# Patient Record
Sex: Female | Born: 1960 | Race: Black or African American | Hispanic: No | Marital: Married | Smoking: Never smoker
Health system: Southern US, Community
[De-identification: ages and names within clinical notes are randomized; demographics above are authoritative.]

## PROBLEM LIST (undated history)

## (undated) DIAGNOSIS — E785 Hyperlipidemia, unspecified: Secondary | ICD-10-CM

## (undated) DIAGNOSIS — M8580 Other specified disorders of bone density and structure, unspecified site: Secondary | ICD-10-CM

## (undated) DIAGNOSIS — IMO0002 Reserved for concepts with insufficient information to code with codable children: Secondary | ICD-10-CM

## (undated) DIAGNOSIS — Z8249 Family history of ischemic heart disease and other diseases of the circulatory system: Secondary | ICD-10-CM

## (undated) DIAGNOSIS — R011 Cardiac murmur, unspecified: Secondary | ICD-10-CM

## (undated) DIAGNOSIS — D649 Anemia, unspecified: Secondary | ICD-10-CM

## (undated) DIAGNOSIS — D563 Thalassemia minor: Secondary | ICD-10-CM

## (undated) DIAGNOSIS — M329 Systemic lupus erythematosus, unspecified: Secondary | ICD-10-CM

## (undated) DIAGNOSIS — M179 Osteoarthritis of knee, unspecified: Secondary | ICD-10-CM

## (undated) DIAGNOSIS — D569 Thalassemia, unspecified: Secondary | ICD-10-CM

## (undated) HISTORY — PX: COLONOSCOPY: SHX174

## (undated) HISTORY — DX: Anemia, unspecified: D64.9

## (undated) HISTORY — DX: Thalassemia minor: D56.3

## (undated) HISTORY — PX: TUBAL LIGATION: SHX77

## (undated) HISTORY — DX: Cardiac murmur, unspecified: R01.1

## (undated) HISTORY — DX: Systemic lupus erythematosus, unspecified: M32.9

## (undated) HISTORY — DX: Osteoarthritis of knee, unspecified: M17.9

## (undated) HISTORY — DX: Other specified disorders of bone density and structure, unspecified site: M85.80

## (undated) HISTORY — DX: Family history of ischemic heart disease and other diseases of the circulatory system: Z82.49

## (undated) HISTORY — DX: Reserved for concepts with insufficient information to code with codable children: IMO0002

## (undated) HISTORY — PX: TONSILLECTOMY: SHX5217

## (undated) HISTORY — DX: Hyperlipidemia, unspecified: E78.5

## (undated) HISTORY — DX: Thalassemia, unspecified: D56.9

---

## 2015-01-11 ENCOUNTER — Ambulatory Visit (INDEPENDENT_AMBULATORY_CARE_PROVIDER_SITE_OTHER): Payer: 59 | Admitting: Family Medicine

## 2015-01-11 VITALS — BP 102/64 | HR 71 | Temp 98.0°F | Resp 18 | Ht 66.0 in | Wt 161.0 lb

## 2015-01-11 DIAGNOSIS — Z130 Encounter for screening for diseases of the blood and blood-forming organs and certain disorders involving the immune mechanism: Secondary | ICD-10-CM | POA: Diagnosis not present

## 2015-01-11 DIAGNOSIS — Z13220 Encounter for screening for lipoid disorders: Secondary | ICD-10-CM | POA: Diagnosis not present

## 2015-01-11 DIAGNOSIS — L723 Sebaceous cyst: Secondary | ICD-10-CM

## 2015-01-11 DIAGNOSIS — Z1329 Encounter for screening for other suspected endocrine disorder: Secondary | ICD-10-CM

## 2015-01-11 DIAGNOSIS — N9089 Other specified noninflammatory disorders of vulva and perineum: Secondary | ICD-10-CM

## 2015-01-11 DIAGNOSIS — Z1239 Encounter for other screening for malignant neoplasm of breast: Secondary | ICD-10-CM

## 2015-01-11 DIAGNOSIS — Z1211 Encounter for screening for malignant neoplasm of colon: Secondary | ICD-10-CM

## 2015-01-11 DIAGNOSIS — Z0000 Encounter for general adult medical examination without abnormal findings: Secondary | ICD-10-CM

## 2015-01-11 DIAGNOSIS — N907 Vulvar cyst: Secondary | ICD-10-CM | POA: Diagnosis not present

## 2015-01-11 LAB — COMPLETE METABOLIC PANEL WITH GFR
ALT: <8 U/L (ref 0–35)
Albumin: 4 g/dL (ref 3.5–5.2)
Alkaline Phosphatase: 55 U/L (ref 39–117)
BUN: 8 mg/dL (ref 6–23)
CO2: 26 mEq/L (ref 19–32)
Calcium: 9 mg/dL (ref 8.4–10.5)
Creat: 0.61 mg/dL (ref 0.50–1.10)
Glucose, Bld: 63 mg/dL — ABNORMAL LOW (ref 70–99)
Potassium: 4.4 mEq/L (ref 3.5–5.3)
Sodium: 137 mEq/L (ref 135–145)
Total Bilirubin: 0.8 mg/dL (ref 0.2–1.2)

## 2015-01-11 LAB — CBC
HCT: 35 % — ABNORMAL LOW (ref 36.0–46.0)
Hemoglobin: 10.5 g/dL — ABNORMAL LOW (ref 12.0–15.0)
MCH: 23 pg — ABNORMAL LOW (ref 26.0–34.0)
MCHC: 30 g/dL (ref 30.0–36.0)
MCV: 76.8 fL — ABNORMAL LOW (ref 78.0–100.0)
MPV: 10.4 fL (ref 8.6–12.4)
Platelets: 299 10*3/uL (ref 150–400)
RBC: 4.56 MIL/uL (ref 3.87–5.11)
RDW: 13.9 % (ref 11.5–15.5)
WBC: 5.8 10*3/uL (ref 4.0–10.5)

## 2015-01-11 LAB — TSH: TSH: 0.889 u[IU]/mL (ref 0.350–4.500)

## 2015-01-11 LAB — COMPLETE METABOLIC PANEL WITHOUT GFR
AST: 15 U/L (ref 0–37)
Chloride: 105 meq/L (ref 96–112)
GFR, Est African American: >89 mL/min
GFR, Est Non African American: >89 mL/min
Total Protein: 7.4 g/dL (ref 6.0–8.3)

## 2015-01-11 LAB — LDL CHOLESTEROL, DIRECT: Direct LDL: 89 mg/dL

## 2015-01-11 NOTE — Patient Instructions (Signed)
Mammography Mammography is an X-ray of the breasts to look for changes that are not normal. The X-ray image is called a mammogram. This procedure can screen for breast cancer, can detect cancer early, and can diagnose cancer.  LET YOUR CAREGIVER KNOW ABOUT:  Breast implants.  Previous breast disease, biopsy, or surgery.  If you are breastfeeding.  Medicines taken, including vitamins, herbs, eyedrops, over-the-counter medicines, and creams.  Use of steroids (by mouth or creams).  Possibility of pregnancy, if this applies. RISKS AND COMPLICATIONS  Exposure to radiation, but at very low levels.  The results may be misinterpreted.  The results may not be accurate.  Mammography may lead to further tests.  Mammography may not catch certain cancers. BEFORE THE PROCEDURE  Schedule your test about 7 days after your menstrual period. This is when your breasts are the least tender and have signs of hormone changes.  If you have had a mammography done at a different facility in the past, get the mammogram X-rays or have them sent to your current exam facility in order to compare them.  Wash your breasts and under your arms the day of the test.  Do not wear deodorants, perfumes, or powders anywhere on your body.  Wear clothes that you can change in and out of easily. PROCEDURE Relax as much as possible during the test. Any discomfort during the test will be very brief. The test should take less than 30 minutes. The following will happen:  You will undress from the waist up and put on a gown.  You will stand in front of the X-ray machine.  Each breast will be placed between 2 plastic or glass plates. The plates will compress your breast for a few seconds.  X-rays will be taken from different angles of the breast. AFTER THE PROCEDURE  The mammogram will be examined.  Depending on the quality of the images, you may need to repeat certain parts of the test.  Ask when your test  results will be ready. Make sure you get your test results.  You may resume normal activities. Document Released: 06/27/2000 Document Revised: 09/22/2011 Document Reviewed: 04/20/2011 ExitCare Patient Information 2015 [employer]. This information is not intended to replace advice given to you by your health care provider. Make sure you discuss any questions you have with your health care provider.  

## 2015-01-11 NOTE — Progress Notes (Signed)
Chief Complaint:  Chief Complaint  Patient presents with  . Annual Exam    last pap 2015    HPI: Alicia Garcia is a 54 y.o. female who is here for   Last annual was Dec 06, 2013--Dr. Heath Gold Last pap was 2015  was normal Abnormal pap in 30s, no abnormal pap since.  She does occ self breast exam, last mammogram was five years, normal.  No hx of cancer, variant, breast, uterine, colon Has not had vision check.  Dx lupus 2 years ago, April 2014 ( not sure but maybe on the fence---Rheumatology Dr Geraldo Docker) No prior hx of steroids.  No sxs and no arm pain.  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  She has a vaginal skin tag or something that bothers her when she wears certain clothing such as underwear. It rubs up against her close it makes it hurt. She denies any bleeding or hematuria or dysuria or vaginal discharge    Past Medical History  Diagnosis Date  . Anemia   . Lupus   . Heart murmur     as a child   Past Surgical History  Procedure Laterality Date  . Tubal ligation    . Tonsillectomy    . Colonoscopy      2012, normal    History   Social History  . Marital Status: Married    Spouse Name: N/A  . Number of Children: N/A  . Years of Education: N/A   Social History Main Topics  . Smoking status: Never Smoker   . Smokeless tobacco: Not on file  . Alcohol Use: No  . Drug Use: No  . Sexual Activity: Not on file   Other Topics Concern  . None   Social History Narrative  . None   Family History  Problem Relation Age of Onset  . Heart disease Brother    No Known Allergies Prior to Admission medications   Medication Sig Start Date End Date Taking? Authorizing Provider  acetaminophen (TYLENOL) 500 MG tablet Take 500 mg by mouth every 6 (six) hours as needed.   Yes Historical Provider, MD  aspirin EC 81 MG tablet Take 81 mg by mouth daily.   Yes Historical  Provider, MD  Ibuprofen (IBU-200 PO) Take by mouth as needed.   Yes Historical Provider, MD     ROS: The patient denies fevers, chills, night sweats, unintentional weight loss, chest pain, palpitations, wheezing, dyspnea on exertion, nausea, vomiting, abdominal pain, dysuria, hematuria, melena, numbness, weakness, or tingling.   All other systems have been reviewed and were otherwise negative with the exception of those mentioned in the HPI and as above.    PHYSICAL EXAM: Filed Vitals:   01/11/15 1210  BP: 102/64  Pulse: 71  Temp: 98 F (36.7 C)  Resp: 18   Filed Vitals:   01/11/15 1210  Height: _0  (1.676 m)  Weight: 161 lb (73.029 kg)   Body mass index is 26 kg/(m^2).   General: Alert, no acute distress HEENT:  Normocephalic, atraumatic, oropharynx patent. EOMI, PERRLA Cardiovascular:  Regular rate and rhythm, no rubs murmurs or gallops.  No Carotid bruits, radial pulse intact. No pedal edema.  Respiratory: Clear to auscultation bilaterally.  No wheezes, rales, or rhonchi.  No cyanosis, no use of accessory musculature GI: No organomegaly, abdomen is soft and non-tender, positive bowel sounds.  No masses. Skin:  No rashes. Neurologic: Facial musculature symmetric. Psychiatric: Patient is appropriate throughout our interaction. Lymphatic: No cervical lymphadenopathy Musculoskeletal: Gait intact. Cervical exam normal . No rashes, no dc, no CMT Breast exam normal She does have a small  nonerthematous skin tag perilabial  area    LABS: Results for orders placed or performed in visit on 01/11/15  CBC  Result Value Ref Range   WBC 5.8 4.0 - 10.5 K/uL   RBC 4.56 3.87 - 5.11 MIL/uL   Hemoglobin 10.5 (L) 12.0 - 15.0 g/dL   HCT 35.0 (L) 36.0 - 46.0 %   MCV 76.8 (L) 78.0 - 100.0 fL   MCH 23.0 (L) 26.0 - 34.0 pg   MCHC 30.0 30.0 - 36.0 g/dL   RDW 13.9 11.5 - 15.5 %   Platelets 299 150 - 400 K/uL   MPV 10.4 8.6 - 12.4 fL  COMPLETE METABOLIC PANEL WITH GFR  Result Value  Ref Range   Sodium 137 135 - 145 mEq/L   Potassium 4.4 3.5 - 5.3 mEq/L   Chloride 105 96 - 112 mEq/L   CO2 26 19 - 32 mEq/L   Glucose, Bld 63 (L) 70 - 99 mg/dL   BUN 8 6 - 23 mg/dL   Creat 0.61 0.50 - 1.10 mg/dL   Total Bilirubin 0.8 0.2 - 1.2 mg/dL   Alkaline Phosphatase 55 39 - 117 U/L   AST 15 0 - 37 U/L   ALT <8 0 - 35 U/L   Total Protein 7.4 6.0 - 8.3 g/dL   Albumin 4.0 3.5 - 5.2 g/dL   Calcium 9.0 8.4 - 10.5 mg/dL   GFR, Est African American >89 mL/min   GFR, Est Non African American >89 mL/min  TSH  Result Value Ref Range   TSH 0.889 0.350 - 4.500 uIU/mL  LDL Cholesterol, Direct  Result Value Ref Range   Direct LDL 89 mg/dL  IFOBT POC (occult bld, rslt in office)  Result Value Ref Range   IFOBT Negative      EKG/XRAY:   Primary read interpreted by Dr. Marin Comment at [facility].   ASSESSMENT/PLAN: Encounter Diagnoses  Name Primary?  . Annual physical exam Yes  . Screening for deficiency anemia   . Screening for hyperlipidemia   . Screening for thyroid disorder   . Special screening for malignant neoplasms, colon   . Screening for breast cancer   . Labial skin tag   . Sebaceous cyst of labia    Annual labs pending Will refer to ob/gyn or plastics for labial skin tag Refer to get mammogram, she has had a colonoscopy within the last 10 years. She does not remember exactly when. She will look in her records and we will try to get records from Dr. Elliot Cousin office. She'll be sent home with a hemosure kit to bring back  Fu prn , potherwise in 1 year  Gross sideeffects, risk and benefits, and alternatives of medications d/w patient. Patient is aware that all medications have potential sideeffects and we are unable to predict every sideeffect or drug-drug interaction that may occur.  [place] Stringfield, [place], DO 01/22/2015 7:47 AM

## 2015-01-20 LAB — IFOBT (OCCULT BLOOD): IFOBT: NEGATIVE

## 2015-02-22 LAB — HM MAMMOGRAPHY

## 2015-03-01 ENCOUNTER — Encounter: Payer: Self-pay | Admitting: *Deleted

## 2015-03-15 ENCOUNTER — Encounter: Payer: Self-pay | Admitting: Family Medicine

## 2015-06-11 ENCOUNTER — Ambulatory Visit (INDEPENDENT_AMBULATORY_CARE_PROVIDER_SITE_OTHER): Payer: 59 | Admitting: Emergency Medicine

## 2015-06-11 ENCOUNTER — Ambulatory Visit (INDEPENDENT_AMBULATORY_CARE_PROVIDER_SITE_OTHER): Payer: 59

## 2015-06-11 VITALS — BP 118/72 | HR 94 | Temp 97.9°F | Resp 18 | Ht 67.0 in | Wt 160.8 lb

## 2015-06-11 DIAGNOSIS — M25511 Pain in right shoulder: Secondary | ICD-10-CM | POA: Diagnosis not present

## 2015-06-11 MED ORDER — NAPROXEN SODIUM 550 MG PO TABS: 550.0000 mg | ORAL_TABLET | Freq: Two times a day (BID) | ORAL | Status: AC

## 2015-06-11 NOTE — Progress Notes (Signed)
Subjective:  Patient ID: Alicia Garcia, female    DOB: 05/07/1961  Age: 54 y.o. MRN: [id number]  CC: Arm Injury   HPI Alicia Garcia presents  she was injured on Saturday while stepping off a ledge onto a roller rink and fell on her right outstretched arm. She has pain and limitation of motion in her right shoulder since the injury. She has no deformity or ecchymosis or swelling. No radiation of pain she has pain with abduction of her arm.  History Alicia Garcia has a past medical history of Anemia; Lupus (HCC); and Heart murmur.   She has past surgical history that includes Tubal ligation; Tonsillectomy; and Colonoscopy.   Her  family history includes Heart disease in her brother.  She   reports that she has never smoked. She does not have any smokeless tobacco history on file. She reports that she does not drink alcohol or use illicit drugs.  Outpatient Prescriptions Prior to Visit  Medication Sig Dispense Refill  . acetaminophen (TYLENOL) 500 MG tablet Take 500 mg by mouth every 6 (six) hours as needed.    Marland Kitchen aspirin EC 81 MG tablet Take 81 mg by mouth daily.    . Ibuprofen (IBU-200 PO) Take by mouth as needed.     No facility-administered medications prior to visit.    Social History   Social History  . Marital Status: Married    Spouse Name: N/A  . Number of Children: N/A  . Years of Education: N/A   Social History Main Topics  . Smoking status: Never Smoker   . Smokeless tobacco: None  . Alcohol Use: No  . Drug Use: No  . Sexual Activity: Not Asked   Other Topics Concern  . None   Social History Narrative     Review of Systems  Constitutional: Negative for fever, chills and appetite change.  HENT: Negative for congestion, ear pain, postnasal drip, sinus pressure and sore throat.   Eyes: Negative for pain and redness.  Respiratory: Negative for cough, shortness of breath and wheezing.   Cardiovascular: Negative for leg swelling.  Gastrointestinal:  Negative for nausea, vomiting, abdominal pain, diarrhea, constipation and blood in stool.  Endocrine: Negative for polyuria.  Genitourinary: Negative for dysuria, urgency, frequency and flank pain.  Musculoskeletal: Negative for gait problem.  Skin: Negative for rash.  Neurological: Negative for weakness and headaches.  Psychiatric/Behavioral: Negative for confusion and decreased concentration. The patient is not nervous/anxious.     Objective:  BP 118/72 mmHg  Pulse 94  Temp(Src) 97.9 F (36.6 C) (Oral)  Resp 18  Ht  (1.702 m)  Wt 160 lb 12.8 oz (72.938 kg)  BMI 25.18 kg/m2  SpO2 95%  LMP 06/06/2015  Physical Exam  Constitutional: She is oriented to person, place, and time. She appears well-developed and well-nourished.  HENT:  Head: Normocephalic and atraumatic.  Eyes: Conjunctivae are normal. Pupils are equal, round, and reactive to light.  Pulmonary/Chest: Effort normal.  Musculoskeletal: She exhibits no edema.       Right shoulder: She exhibits decreased range of motion, tenderness and pain.  Neurological: She is alert and oriented to person, place, and time.  Skin: Skin is dry.  Psychiatric: She has a normal mood and affect. Her behavior is normal. Thought content normal.      Assessment & Plan:   Alicia Garcia was seen today for arm injury.  Diagnoses and all orders for this visit:  Pain in joint of right shoulder -  DG Shoulder Right; Future  Other orders -     naproxen sodium (ANAPROX DS) 550 MG tablet; Take 1 tablet (550 mg total) by mouth 2 (two) times daily with a meal.   I am having Alicia Garcia start on naproxen sodium. I am also having her maintain her Ibuprofen (IBU-200 PO), acetaminophen, and aspirin EC.  Meds ordered this encounter  Medications  . naproxen sodium (ANAPROX DS) 550 MG tablet    Sig: Take 1 tablet (550 mg total) by mouth 2 (two) times daily with a meal.    Dispense:  40 tablet    Refill:  0    Appropriate red flag conditions  were discussed with the patient as well as actions that should be taken.  Patient expressed his understanding.  Follow-up: Return in about 1 week (around 06/18/2015).  Carmelina DaneAnderson, Jeffery S, MD   [facility] reading (PRIMARY) by  Dr. Dareen PianoAnderson. negative.

## 2015-06-11 NOTE — Patient Instructions (Signed)
Rotator Cuff Injury °Rotator cuff injury is any type of injury to the set of muscles and tendons that make up the stabilizing unit of your shoulder. This unit holds the ball of your upper arm bone (humerus) in the socket of your shoulder blade (scapula).  °CAUSES °Injuries to your rotator cuff most commonly come from sports or activities that cause your arm to be moved repeatedly over your head. Examples of this include throwing, weight lifting, swimming, or racquet sports. Long lasting (chronic) irritation of your rotator cuff can cause soreness and swelling (inflammation), bursitis, and eventual damage to your tendons, such as a tear (rupture). °SIGNS AND SYMPTOMS °Acute rotator cuff tear: °· Sudden tearing sensation followed by severe pain shooting from your upper shoulder down your arm toward your elbow. °· Decreased range of motion of your shoulder because of pain and muscle spasm. °· Severe pain. °· Inability to raise your arm out to the side because of pain and loss of muscle power (large tears). °Chronic rotator cuff tear: °· Pain that usually is worse at night and may interfere with sleep. °· Gradual weakness and decreased shoulder motion as the pain worsens. °· Decreased range of motion. °Rotator cuff tendinitis:  °· Deep ache in your shoulder and the outside upper arm over your shoulder. °· Pain that comes on gradually and becomes worse when lifting your arm to the side or turning it inward. °DIAGNOSIS °Rotator cuff injury is diagnosed through a medical history, physical exam, and imaging exam. The medical history helps determine the type of rotator cuff injury. Your health care provider will look at your injured shoulder, feel the injured area, and ask you to move your shoulder in different positions. X-ray exams typically are done to rule out other causes of shoulder pain, such as fractures. MRI is the exam of choice for the most severe shoulder injuries because the images show muscles and tendons.    °TREATMENT  °Chronic tear: °· Medicine for pain, such as acetaminophen or ibuprofen. °· Physical therapy and range-of-motion exercises may be helpful in maintaining shoulder function and strength. °· Steroid injections into your shoulder joint. °· Surgical repair of the rotator cuff if the injury does not heal with noninvasive treatment. °Acute tear: °· Anti-inflammatory medicines such as ibuprofen and naproxen to help reduce pain and swelling. °· A sling to help support your arm and rest your rotator cuff muscles. Long-term use of a sling is not advised. It may cause significant stiffening of the shoulder joint. °· Surgery may be considered within a few weeks, especially in younger, active people, to return the shoulder to full function. °· Indications for surgical treatment include the following: °¨ Age younger than 60 years. °¨ Rotator cuff tears that are complete. °¨ Physical therapy, rest, and anti-inflammatory medicines have been used for 6-8 weeks, with no improvement. °¨ Employment or sporting activity that requires constant shoulder use. °Tendinitis: °· Anti-inflammatory medicines such as ibuprofen and naproxen to help reduce pain and swelling. °· A sling to help support your arm and rest your rotator cuff muscles. Long-term use of a sling is not advised. It may cause significant stiffening of the shoulder joint. °· Severe tendinitis may require: °¨ Steroid injections into your shoulder joint. °¨ Physical therapy. °¨ Surgery. °HOME CARE INSTRUCTIONS  °· Apply ice to your injury: °¨ Put ice in a plastic bag. °¨ Place a towel between your skin and the bag. °¨ Leave the ice on for 20 minutes, 2-3 times a day. °· If you   have a shoulder immobilizer (sling and straps), wear it until told otherwise by your health care provider. °· You may want to sleep on several pillows or in a recliner at night to lessen swelling and pain. °· Only take over-the-counter or prescription medicines for pain, discomfort, or fever as  directed by your health care provider. °· Do simple hand squeezing exercises with a soft rubber ball to decrease hand swelling. °SEEK MEDICAL CARE IF:  °· Your shoulder pain increases, or new pain or numbness develops in your arm, hand, or fingers. °· Your hand or fingers are colder than your other hand. °SEEK IMMEDIATE MEDICAL CARE IF:  °· Your arm, hand, or fingers are numb or tingling. °· Your arm, hand, or fingers are increasingly swollen and painful, or they turn white or blue. °MAKE SURE YOU: °· Understand these instructions. °· Will watch your condition. °· Will get help right away if you are not doing well or get worse. °  °This information is not intended to replace advice given to you by your health care provider. Make sure you discuss any questions you have with your health care provider. °  °Document Released: 06/27/2000 Document Revised: 07/05/2013 Document Reviewed: 02/09/2013 °Elsevier Interactive Patient Education ©2016 [employer] ° °

## 2016-02-12 ENCOUNTER — Ambulatory Visit: Payer: 59 | Attending: Rheumatology | Admitting: Physical Therapy

## 2016-02-12 DIAGNOSIS — M25511 Pain in right shoulder: Secondary | ICD-10-CM | POA: Insufficient documentation

## 2016-02-12 DIAGNOSIS — M25611 Stiffness of right shoulder, not elsewhere classified: Secondary | ICD-10-CM

## 2016-02-12 NOTE — Therapy (Signed)
[facility] [address] [place], [place], [address] Phone: [phone]   Fax:  [phone]  Physical Therapy Evaluation  Patient Details  Name: Alicia Garcia MRN: [id number] Date of Birth: 1960/11/04 Referring Provider: Tawni Pummel   Encounter Date: 02/12/2016      PT End of Session - 02/12/16 1421    Visit Number 1   Number of Visits 12   Date for PT Re-Evaluation 03/25/16   PT Start Time 0153   PT Stop Time 0228   PT Time Calculation (min) 35 min   Activity Tolerance Patient tolerated treatment well   Behavior During Therapy [facility] for tasks assessed/performed      Past Medical History:  Diagnosis Date  . Anemia   . Heart murmur    as a child  . Lupus [facility])     Past Surgical History:  Procedure Laterality Date  . COLONOSCOPY     2012, normal   . TONSILLECTOMY    . TUBAL LIGATION      There were no vitals filed for this visit.       Subjective Assessment - 02/12/16 1826    Subjective My shoulder doesn't hurt much at rest.   Limitations --  Trying to reach overhead.   Patient Stated Goals Use my right shoulder agin.   Currently in Pain? No/denies            [facility] PT Assessment - 02/12/16 0001      Assessment   Medical Diagnosis Right shoulder pain.   Referring Provider Tawni Pummel    Onset Date/Surgical Date --  November 2016.     Precautions   Precautions None     Restrictions   Weight Bearing Restrictions No     Balance Screen   Has the patient fallen in the past 6 months No   Has the patient had a decrease in activity level because of a fear of falling?  No   Is the patient reluctant to leave their home because of a fear of falling?  No     Home Environment   Living Environment Private residence     Prior Function   Level of Independence Independent     Posture/Postural Control   Posture/Postural Control Postural limitations   Postural Limitations Rounded Shoulders;Forward head      ROM / Strength   AROM / PROM / Strength AROM;Strength     AROM   Overall AROM Comments Active antigravity right shoulder flexion= 115 degrees; ER= 59 degrees and behind back to S1.     Strength   Overall Strength Comments Right shoulder abduction; IR/ER= 4-/5.     Palpation   Palpation comment Tender over right ACJ.     Special Tests    Special Tests --  (-) Right Drop Arm test.     Ambulation/Gait   Gait Comments WNL.                           PT Education - 02/12/16 1825    Education provided Yes   Education Details Home pulley system.   Person(s) Educated Patient   Methods Explanation;Demonstration   Comprehension Verbalized understanding;Returned demonstration          PT Short Term Goals - 02/12/16 1837      PT SHORT TERM GOAL #1   Title Independent with a HEP.   Time 8   Period Weeks   Status New  PT SHORT TERM GOAL #2   Title Active right shoulder flexion to 150 degrees so the patient can easily reach overhead   Time 8   Period Weeks   Status New     PT SHORT TERM GOAL #3   Title Active ER to 80 degrees+ to allow for easily donning/doffing of apparel   Time 8   Period Weeks   Status New     PT SHORT TERM GOAL #4   Title Patient able to reach behind back to L2 with right hand.   Time 8   Period Weeks   Status New     PT SHORT TERM GOAL #5   Title Increase right shoulder strength to a solid 4+/5 to increase stability for performance of functional activities   Time 8   Period Weeks                  Plan - 02/12/16 1827    Clinical Impression Statement The patient was at a skating rink on November of 2016 and slipped and fell backward on an extended right UE.  She felt immediate pain and was unable to use her right arm.  She states she starting moving her shoulder and it did slowly improve.  Unfortunately her shoulder began to get stiff.  She does experience pain at endrange (4-5/10) movement.  She demonstrates  multi-directional loss of right shoulder motion.  She demonstrtaed a negative right Drop Arm test.   Rehab Potential Excellent   PT Frequency 2x / week   PT Duration 8 weeks   PT Treatment/Interventions ADLs/Self Care Home Management;Therapeutic activities;Therapeutic exercise;Neuromuscular re-education;Patient/family education;Passive range of motion;Manual techniques   PT Next Visit Plan Pulleys; UE Ranger; PROM to right shoulder; capsular stretching; towel stretch; wall climbs and wall ladder.  Progress to RW4 exercise with yellow band.      Patient will benefit from skilled therapeutic intervention in order to improve the following deficits and impairments:  Pain, Decreased activity tolerance, Decreased strength, Decreased range of motion  Visit Diagnosis: Pain in right shoulder - Plan: PT plan of care cert/re-cert  Stiffness of right shoulder, not elsewhere classified - Plan: PT plan of care cert/re-cert     Problem List There are no active problems to display for this patient.   Paulene Tayag, [place] MPT 02/12/2016, 6:47 PM  [facility] [address] [place], [place], [address] Phone: [phone]   Fax:  [phone]  Name: Alicia Garcia MRN: [id number] Date of Birth: January 11, 1961

## 2016-02-12 NOTE — Patient Instructions (Signed)
Patient was provided with a home pulley system.

## 2016-02-19 ENCOUNTER — Encounter: Payer: Self-pay | Admitting: *Deleted

## 2016-02-26 ENCOUNTER — Encounter: Payer: Self-pay | Admitting: Physical Therapy

## 2016-02-26 ENCOUNTER — Ambulatory Visit: Payer: 59 | Admitting: Physical Therapy

## 2016-02-26 DIAGNOSIS — M25511 Pain in right shoulder: Secondary | ICD-10-CM | POA: Diagnosis not present

## 2016-02-26 DIAGNOSIS — M25611 Stiffness of right shoulder, not elsewhere classified: Secondary | ICD-10-CM

## 2016-02-26 NOTE — Therapy (Signed)
[facility] [address] [place], [place], [address] Phone: [phone]   Fax:  [phone]  Physical Therapy Treatment  Patient Details  Name: Alicia Garcia MRN: [id number] Date of Birth: Feb 09, 1961 Referring Provider: Tawni PummelNaitik Panwala   Encounter Date: 02/26/2016      PT End of Session - 02/26/16 0734    Visit Number 2   Number of Visits 12   Date for PT Re-Evaluation 03/25/16   PT Start Time 0734   PT Stop Time 0815   PT Time Calculation (min) 41 min   Activity Tolerance Patient tolerated treatment well   Behavior During Therapy [facility] for tasks assessed/performed      Past Medical History:  Diagnosis Date  . Anemia   . Heart murmur    as a child  . Lupus [facility](HCC)     Past Surgical History:  Procedure Laterality Date  . COLONOSCOPY     2012, normal   . TONSILLECTOMY    . TUBAL LIGATION      There were no vitals filed for this visit.      Subjective Assessment - 02/26/16 0734    Subjective Reports stiffness this morning.   Patient Stated Goals Use my right shoulder agin.   Currently in Pain? No/denies            [facility] PT Assessment - 02/26/16 0001      Assessment   Medical Diagnosis Right shoulder pain.     Precautions   Precautions None     Restrictions   Weight Bearing Restrictions No                     [facility] Adult PT Treatment/Exercise - 02/26/16 0001      Exercises   Exercises Shoulder     Shoulder Exercises: Seated   Other Seated Exercises RUE ranger flex/circls x20 reps   Other Seated Exercises B scapular squeeze and rolls x20 reps each     Shoulder Exercises: Standing   Other Standing Exercises RUE wall ladder x20 reps     Shoulder Exercises: Pulleys   Flexion Other (comment)  x5 min     Shoulder Exercises: Stretch   Internal Rotation Stretch 20 seconds  x3 reps   Other Shoulder Stretches R sleeper stretch in R sidelying 3x 20 sec hold     Manual Therapy   Manual  Therapy Passive ROM   Passive ROM PROM to R shoulder into flex/scap/ER/IR with gentle holds at end range                  PT Short Term Goals - 02/12/16 1837      PT SHORT TERM GOAL #1   Title Independent with a HEP.   Time 8   Period Weeks   Status New     PT SHORT TERM GOAL #2   Title Active right shoulder flexion to 150 degrees so the patient can easily reach overhead   Time 8   Period Weeks   Status New     PT SHORT TERM GOAL #3   Title Active ER to 80 degrees+ to allow for easily donning/doffing of apparel   Time 8   Period Weeks   Status New     PT SHORT TERM GOAL #4   Title Patient able to reach behind back to L2 with right hand.   Time 8   Period Weeks   Status New     PT SHORT TERM GOAL #  5   Title Increase right shoulder strength to a solid 4+/5 to increase stability for performance of functional activities   Time 8   Period Weeks                  Plan - 02/26/16 0818    Clinical Impression Statement Patient arrived to treatment denying R shoulder pain only stiffness. Patient acknowledged a stretch with wall ladder exercise. Patient did not report any discomfort with any of the exercises today. Patient required minimal to moderate multimodal cueing for proper exercise technique. Firm end feels noted with all directions of PROM of R shoulder. Stiffness noted at end range R shoulder flexion and scaption today. Patient denied R shoulder pain or discomfort following today's treatment.   Rehab Potential Excellent   PT Frequency 2x / week   PT Duration 8 weeks   PT Treatment/Interventions ADLs/Self Care Home Management;Therapeutic activities;Therapeutic exercise;Neuromuscular re-education;Patient/family education;Passive range of motion;Manual techniques   PT Next Visit Plan Continue R shoulder ROM and progress to strengthening as symptoms dictate per MPT POC.   Consulted and Agree with Plan of Care Patient      Patient will benefit from skilled  therapeutic intervention in order to improve the following deficits and impairments:  Pain, Decreased activity tolerance, Decreased strength, Decreased range of motion  Visit Diagnosis: Pain in right shoulder  Stiffness of right shoulder, not elsewhere classified     Problem List There are no active problems to display for this patient.   Evelene CroonKelsey M Parsons, PTA 02/26/2016, 8:26 AM  [facility] [address] [place], [place], [address] Phone: [phone]   Fax:  [phone]  Name: Alicia Garcia MRN: [id number] Date of Birth: February 04, 1961

## 2016-03-03 ENCOUNTER — Ambulatory Visit: Payer: 59 | Admitting: Physical Therapy

## 2016-03-03 ENCOUNTER — Encounter: Payer: Self-pay | Admitting: Physical Therapy

## 2016-03-03 DIAGNOSIS — M25511 Pain in right shoulder: Secondary | ICD-10-CM

## 2016-03-03 DIAGNOSIS — M25611 Stiffness of right shoulder, not elsewhere classified: Secondary | ICD-10-CM

## 2016-03-03 NOTE — Therapy (Signed)
[facility] [place], [place], [address] Phone: [phone]   Fax:  [phone]  Physical Therapy Treatment  Patient Details  Name: Alicia Garcia MRN: [id number] Date of Birth: 1961/07/12 Referring Provider: Eliezer Lofts   Encounter Date: 03/03/2016      PT End of Session - 03/03/16 0811    Visit Number 3   Number of Visits 12   Date for PT Re-Evaluation 03/25/16   PT Start Time 0735   PT Stop Time 0815   PT Time Calculation (min) 40 min   Activity Tolerance Patient tolerated treatment well   Behavior During Therapy [facility] for tasks assessed/performed      Past Medical History:  Diagnosis Date  . Anemia   . Heart murmur    as a child  . Lupus [facility])     Past Surgical History:  Procedure Laterality Date  . COLONOSCOPY     2012, normal   . TONSILLECTOMY    . TUBAL LIGATION      There were no vitals filed for this visit.      Subjective Assessment - 03/03/16 0737    Subjective Patient reported improvement overall and is able to lift arm now to put on deodorant   Patient Stated Goals Use my right shoulder agin.   Currently in Pain? No/denies            [facility] PT Assessment - 03/03/16 0001      ROM / Strength   AROM / PROM / Strength AROM;PROM     AROM   AROM Assessment Site Shoulder   Right/Left Shoulder Right   Right Shoulder Flexion 138 Degrees   Right Shoulder External Rotation 45 Degrees     PROM   PROM Assessment Site Shoulder   Right/Left Shoulder Right   Right Shoulder Flexion 142 Degrees   Right Shoulder External Rotation 52 Degrees                     [facility] Adult PT Treatment/Exercise - 03/03/16 0001      Shoulder Exercises: Seated   Other Seated Exercises RUE ranger flex/circls x20 reps     Shoulder Exercises: Standing   Other Standing Exercises RW4 with yellow t-band 3x10     Shoulder Exercises: Pulleys   Flexion --  95mn     Manual Therapy   Manual  Therapy Passive ROM   Passive ROM PROM to R shoulder into flex/scap/ER/IR with gentle holds at end range                PT Education - 03/03/16 0811    Education provided Yes   Education Details HEP cane ex's   Person(s) Educated Patient   Methods Explanation;Demonstration;Handout   Comprehension Verbalized understanding;Returned demonstration          PT Short Term Goals - 03/03/16 0745      PT SHORT TERM GOAL #1   Title Independent with a HEP.   Time 8   Period Weeks   Status Achieved     PT SHORT TERM GOAL #2   Title Active right shoulder flexion to 150 degrees so the patient can easily reach overhead   Time 8   Period Weeks   Status On-going  AROM 138 degrees 03/03/16     PT SHORT TERM GOAL #3   Title Active ER to 80 degrees+ to allow for easily donning/doffing of apparel   Time 8   Period Weeks  Status On-going  AROM 45 degrees 03/03/16     PT SHORT TERM GOAL #4   Title Patient able to reach behind back to L2 with right hand.   Time 8   Period Weeks   Status On-going     PT SHORT TERM GOAL #5   Title Increase right shoulder strength to a solid 4+/5 to increase stability for performance of functional activities   Time 8   Period Weeks   Status On-going                  Plan - 03/03/16 5374    Clinical Impression Statement Patient progressing with all activities today. Patient had no pain pre or post treatment today and tolerated treatment well. Patient was given HEP for supine exercises to increase ROM for both shoulder flexion and ER. Patient improved with ROM passively and actively. Patient met STG #1 others ongoing due to ROM and strength deficits.   Rehab Potential Excellent   PT Frequency 2x / week   PT Duration 8 weeks   PT Treatment/Interventions ADLs/Self Care Home Management;Therapeutic activities;Therapeutic exercise;Neuromuscular re-education;Patient/family education;Passive range of motion;Manual techniques   PT Next Visit  Plan Continue R shoulder ROM and progress to strengthening as symptoms dictate per MPT POC. May issue RW4 as HEP if patient tolerated well   Consulted and Agree with Plan of Care Patient      Patient will benefit from skilled therapeutic intervention in order to improve the following deficits and impairments:  Pain, Decreased activity tolerance, Decreased strength, Decreased range of motion  Visit Diagnosis: Pain in right shoulder  Stiffness of right shoulder, not elsewhere classified     Problem List There are no active problems to display for this patient.   Phillips Climes 03/03/2016, 8:41 AM  [facility] [place], [place], [address] Phone: [phone]   Fax:  [phone]  Name: Alicia Garcia MRN: [id number] Date of Birth: 1961/03/01

## 2016-03-03 NOTE — Therapy (Signed)
[facility] [place], [place], [address] Phone: [phone]   Fax:  [phone]  Physical Therapy Treatment  Patient Details  Name: Alicia Garcia MRN: [id number] Date of Birth: 1960/09/27 Referring Provider: Eliezer Lofts   Encounter Date: 03/03/2016      PT End of Session - 03/03/16 0811    Visit Number 3   Number of Visits 12   Date for PT Re-Evaluation 03/25/16   PT Start Time 0735   PT Stop Time 0815   PT Time Calculation (min) 40 min   Activity Tolerance Patient tolerated treatment well   Behavior During Therapy [facility] for tasks assessed/performed      Past Medical History:  Diagnosis Date  . Anemia   . Heart murmur    as a child  . Lupus [facility])     Past Surgical History:  Procedure Laterality Date  . COLONOSCOPY     2012, normal   . TONSILLECTOMY    . TUBAL LIGATION      There were no vitals filed for this visit.      Subjective Assessment - 03/03/16 0737    Subjective Patient reported improvement overall and is able to lift arm now to put on deodorant   Patient Stated Goals Use my right shoulder agin.   Currently in Pain? No/denies            [facility] PT Assessment - 03/03/16 0001      ROM / Strength   AROM / PROM / Strength AROM;PROM     AROM   AROM Assessment Site Shoulder   Right/Left Shoulder Right   Right Shoulder Flexion 138 Degrees   Right Shoulder External Rotation 45 Degrees     PROM   PROM Assessment Site Shoulder   Right/Left Shoulder Right   Right Shoulder Flexion 142 Degrees   Right Shoulder External Rotation 52 Degrees                     [facility] Adult PT Treatment/Exercise - 03/03/16 0001      Shoulder Exercises: Seated   Other Seated Exercises RUE ranger flex/circls x20 reps     Shoulder Exercises: Pulleys   Flexion --  44mn     Manual Therapy   Manual Therapy Passive ROM   Passive ROM PROM to R shoulder into flex/scap/ER/IR with gentle holds at  end range                PT Education - 03/03/16 0811    Education provided Yes   Education Details HEP cane ex's   Person(s) Educated Patient   Methods Explanation;Demonstration;Handout   Comprehension Verbalized understanding;Returned demonstration          PT Short Term Goals - 03/03/16 0745      PT SHORT TERM GOAL #1   Title Independent with a HEP.   Time 8   Period Weeks   Status Achieved     PT SHORT TERM GOAL #2   Title Active right shoulder flexion to 150 degrees so the patient can easily reach overhead   Time 8   Period Weeks   Status On-going  AROM 138 degrees 03/03/16     PT SHORT TERM GOAL #3   Title Active ER to 80 degrees+ to allow for easily donning/doffing of apparel   Time 8   Period Weeks   Status On-going  AROM 45 degrees 03/03/16     PT SHORT TERM GOAL #4  Title Patient able to reach behind back to L2 with right hand.   Time 8   Period Weeks   Status On-going     PT SHORT TERM GOAL #5   Title Increase right shoulder strength to a solid 4+/5 to increase stability for performance of functional activities   Time 8   Period Weeks   Status On-going                  Plan - 03/03/16 9012    Clinical Impression Statement Patient progressing with all activities today. Patient had no pain pre or post treatment today and tolerated treatment well. Patient was given HEP for supine exercises to increase ROM for both shoulder flexion and ER. Patient improved with ROM passively and actively. Patient met STG #1 others ongoing due to ROM and strength deficits.   Rehab Potential Excellent   PT Frequency 2x / week   PT Duration 8 weeks   PT Treatment/Interventions ADLs/Self Care Home Management;Therapeutic activities;Therapeutic exercise;Neuromuscular re-education;Patient/family education;Passive range of motion;Manual techniques   PT Next Visit Plan Continue R shoulder ROM and progress to strengthening as symptoms dictate per MPT POC. May  issue RW4 as HEP if patient tolerated well   Consulted and Agree with Plan of Care Patient      Patient will benefit from skilled therapeutic intervention in order to improve the following deficits and impairments:  Pain, Decreased activity tolerance, Decreased strength, Decreased range of motion  Visit Diagnosis: Pain in right shoulder  Stiffness of right shoulder, not elsewhere classified     Problem List There are no active problems to display for this patient.   Phillips Climes, PTA 03/03/2016, 8:18 AM  [facility] [place], [place], [address] Phone: [phone]   Fax:  [phone]  Name: Alicia Garcia MRN: [id number] Date of Birth: June 21, 1961

## 2016-03-03 NOTE — Patient Instructions (Signed)
ROM: External / Internal Rotation - Wand   Holding wand with left hand palm up, push out from body with other hand, palm down. Keep both elbows bent. When stretch is felt, hold _5___ seconds. Repeat to other side, leading with same hand. Keep elbows bent. Repeat __10__ times per set. Do __2-3__ sets per session. Do __2__ sessions per day.  http://orth.exer.us/748   Copyright  VHI. All rights reserved.  ROM: Flexion - Wand (Supine)   Lie on back holding wand. Raise arms over head.  Repeat __10__ times per set. Do _2-3___ sets per session. Do _2___ sessions per day.  http://orth.exer.us/928   Copyright  VHI. All rights reserved.   

## 2016-03-11 ENCOUNTER — Ambulatory Visit: Payer: 59 | Admitting: Physical Therapy

## 2016-03-11 ENCOUNTER — Encounter: Payer: Self-pay | Admitting: Physical Therapy

## 2016-03-11 DIAGNOSIS — M25511 Pain in right shoulder: Secondary | ICD-10-CM | POA: Diagnosis not present

## 2016-03-11 DIAGNOSIS — M25611 Stiffness of right shoulder, not elsewhere classified: Secondary | ICD-10-CM

## 2016-03-11 NOTE — Patient Instructions (Signed)
  Strengthening: Resisted Flexion   Hold tubing with left arm at side. Pull forward and up. Move shoulder through pain-free range of motion. Repeat __10__ times per set. Do _2-3___ sets per session. Do _2-3___ sessions per day. http://orth.exer.us/824   Copyright  VHI. All rights reserved.  Strengthening: Resisted Extension   Hold tubing in right hand, arm forward. Pull arm back, elbow straight. Repeat __10__ times per set. Do _2-3___ sets per session. Do _2-3___ sessions per day.  http://orth.exer.us/832   Copyright  VHI. All rights reserved.  Strengthening: Resisted Internal Rotation   Hold tubing in left hand, elbow at side and forearm out. Rotate forearm in across body. Repeat __10__ times per set. Do _2-3___ sets per session. Do _2-3___ sessions per day.  http://orth.exer.us/830   Copyright  VHI. All rights reserved.  Strengthening: Resisted External Rotation   Hold tubing in right hand, elbow at side and forearm across body. Rotate forearm out. Repeat __10__ times per set. Do __2-3__ sets per session. Do ____ sessions per day.  http://orth.exer.us/828   Copyright  VHI. All rights reserved.    

## 2016-03-11 NOTE — Therapy (Signed)
[facility] [address] [place], [place], [address] Phone: [phone]   Fax:  [phone]  Physical Therapy Treatment  Patient Details  Name: Alicia Garcia MRN: [id number] Date of Birth: 09-Feb-1961 Referring Provider: Tawni PummelNaitik Panwala   Encounter Date: 03/11/2016      PT End of Session - 03/11/16 0749    Visit Number 4   Number of Visits 12   Date for PT Re-Evaluation 03/25/16   PT Start Time 0737   PT Stop Time 0820   PT Time Calculation (min) 43 min   Activity Tolerance Patient tolerated treatment well   Behavior During Therapy [facility] for tasks assessed/performed      Past Medical History:  Diagnosis Date  . Anemia   . Heart murmur    as a child  . Lupus [facility](HCC)     Past Surgical History:  Procedure Laterality Date  . COLONOSCOPY     2012, normal   . TONSILLECTOMY    . TUBAL LIGATION      There were no vitals filed for this visit.      Subjective Assessment - 03/11/16 0743    Subjective Patient continues to report improvement overall and did good after last treatment   Patient Stated Goals Use my right shoulder agin.   Currently in Pain? No/denies            [facility] - 03/11/16 0001      AROM   AROM Assessment Site Shoulder   Right/Left Shoulder Right   Right Shoulder Flexion 140 Degrees   Right Shoulder External Rotation 58 Degrees     PROM   PROM Assessment Site Shoulder   Right/Left Shoulder Right   Right Shoulder Flexion 142 Degrees   Right Shoulder External Rotation 74 Degrees                     [facility] Adult PT Treatment/Exercise - 03/11/16 0001      Shoulder Exercises: Seated   Other Seated Exercises RUE ranger flex/circls x20 reps     Shoulder Exercises: Standing   Other Standing Exercises RW4 with yellow t-band 3x10     Shoulder Exercises: Pulleys   Flexion Other (comment)  5min     Shoulder Exercises: Stretch   Other Shoulder Stretches wall slides 2x10       Manual Therapy   Manual Therapy Passive ROM   Passive ROM PROM to R shoulder into flex/scap/ER/IR with gentle holds at end range                PT Education - 03/11/16 0748    Education provided Yes   Education Details HEP RW4 with yellow t-band   Person(s) Educated Patient   Methods Explanation;Demonstration;Handout   Comprehension Verbalized understanding;Returned demonstration          PT Short Term Goals - 03/03/16 0745      PT SHORT TERM GOAL #1   Title Independent with a HEP.   Time 8   Period Weeks   Status Achieved     PT SHORT TERM GOAL #2   Title Active right shoulder flexion to 150 degrees so the patient can easily reach overhead   Time 8   Period Weeks   Status On-going  AROM 138 degrees 03/03/16     PT SHORT TERM GOAL #3   Title Active ER to 80 degrees+ to allow for easily donning/doffing of apparel   Time 8   Period Weeks  Status On-going  AROM 45 degrees 03/03/16     PT SHORT TERM GOAL #4   Title Patient able to reach behind back to L2 with right hand.   Time 8   Period Weeks   Status On-going     PT SHORT TERM GOAL #5   Title Increase right shoulder strength to a solid 4+/5 to increase stability for performance of functional activities   Time 8   Period Weeks   Status On-going                  Plan - 03/11/16 0815    Clinical Impression Statement Patient progressing with improved ER ROM and no pain reports. Patient unable to improve flexion PROM today due to tightness in right shoulder. HEP given for RW4 today with yellow t-band with patient independent. Patient unable to meet any further goals due to ROM and strength deficits.   Rehab Potential Excellent   PT Frequency 2x / week   PT Duration 8 weeks   PT Treatment/Interventions ADLs/Self Care Home Management;Therapeutic activities;Therapeutic exercise;Neuromuscular re-education;Patient/family education;Passive range of motion;Manual techniques   PT Next Visit Plan  Continue R shoulder ROM esp shoulder flexion and progress to strengthening as symptoms dictate per MPT POC   Consulted and Agree with Plan of Care Patient      Patient will benefit from skilled therapeutic intervention in order to improve the following deficits and impairments:  Pain, Decreased activity tolerance, Decreased strength, Decreased range of motion  Visit Diagnosis: Pain in right shoulder  Stiffness of right shoulder, not elsewhere classified     Problem List There are no active problems to display for this patient.   Hermelinda Dellen, PTA 03/11/2016, 8:22 AM  [facility] [address] [place], [place], [address] Phone: [phone]   Fax:  [phone]  Name: Alicia Garcia MRN: [id number] Date of Birth: Sep 10, 1960

## 2016-03-19 ENCOUNTER — Encounter: Payer: 59 | Admitting: Physical Therapy

## 2016-03-25 ENCOUNTER — Ambulatory Visit: Payer: 59 | Attending: Rheumatology | Admitting: *Deleted

## 2016-03-25 DIAGNOSIS — M25611 Stiffness of right shoulder, not elsewhere classified: Secondary | ICD-10-CM

## 2016-03-25 DIAGNOSIS — M25511 Pain in right shoulder: Secondary | ICD-10-CM | POA: Diagnosis present

## 2016-03-25 NOTE — Therapy (Signed)
[facility] [address] [place], [place], [address] Phone: [phone]   Fax:  [phone]  Physical Therapy Treatment  Patient Details  Name: Alicia Garcia MRN: [id number] Date of Birth: Sep 21, 1960 Referring Provider: Tawni PummelNaitik Panwala   Encounter Date: 03/25/2016      PT End of Session - 03/25/16 0917    Visit Number 5   Number of Visits 12   Date for PT Re-Evaluation 03/25/16   PT Start Time 0841  25 mins late   PT Stop Time 0909   PT Time Calculation (min) 28 min      Past Medical History:  Diagnosis Date  . Anemia   . Heart murmur    as a child  . Lupus [facility](HCC)     Past Surgical History:  Procedure Laterality Date  . COLONOSCOPY     2012, normal   . TONSILLECTOMY    . TUBAL LIGATION      There were no vitals filed for this visit.      Subjective Assessment - 03/25/16 0919    Subjective Patient continues to report improvement overall and did good after last treatment. 25 mins late due to weather   Patient Stated Goals Use my right shoulder agin.   Currently in Pain? Yes   Pain Score 2    Pain Orientation Right   Pain Descriptors / Indicators Tightness   Pain Onset More than a month ago   Pain Frequency Intermittent   Aggravating Factors  ADLs, stretching                         [facility] Adult PT Treatment/Exercise - 03/25/16 0001      Exercises   Exercises Shoulder     Manual Therapy   Manual Therapy Passive ROM   Passive ROM PROM/ AAROMto R shoulder into flex/scap/ER/IR with gentle holds at end range. ACJ cross-friction mass. Did well with Contract/relax for elevation ROM         UBE x5 mins at 120 RPMs              PT Short Term Goals - 03/03/16 0745      PT SHORT TERM GOAL #1   Title Independent with a HEP.   Time 8   Period Weeks   Status Achieved     PT SHORT TERM GOAL #2   Title Active right shoulder flexion to 150 degrees so the patient can easily reach overhead    Time 8   Period Weeks   Status On-going  AROM 138 degrees 03/03/16     PT SHORT TERM GOAL #3   Title Active ER to 80 degrees+ to allow for easily donning/doffing of apparel   Time 8   Period Weeks   Status On-going  AROM 45 degrees 03/03/16     PT SHORT TERM GOAL #4   Title Patient able to reach behind back to L2 with right hand.   Time 8   Period Weeks   Status On-going     PT SHORT TERM GOAL #5   Title Increase right shoulder strength to a solid 4+/5 to increase stability for performance of functional activities   Time 8   Period Weeks   Status On-going                  Plan - 03/25/16 [id number]    Clinical Impression Statement Pt was 25 mins late due to weather so Rx was  focused on manual for AAROM and PROM. She did well with contract /relax technique for elevation using shoulder Extension  reciprocal inhibition. She was also tender over RT ACJ and did well with X-friction massage. PROM today for elevation was 150 degrees and ER to 70 degrees in scaption   Rehab Potential Excellent   PT Frequency 2x / week   PT Duration 8 weeks   PT Treatment/Interventions ADLs/Self Care Home Management;Therapeutic activities;Therapeutic exercise;Neuromuscular re-education;Patient/family education;Passive range of motion;Manual techniques   PT Next Visit Plan Continue R shoulder ROM esp shoulder flexion and progress to strengthening as symptoms dictate per MPT POC   Consulted and Agree with Plan of Care Patient      Patient will benefit from skilled therapeutic intervention in order to improve the following deficits and impairments:  Pain, Decreased activity tolerance, Decreased strength, Decreased range of motion  Visit Diagnosis: Pain in right shoulder  Stiffness of right shoulder, not elsewhere classified     Problem List There are no active problems to display for this patient.   RAMSEUR,CHRIS, PTA 03/25/2016, 9:57 AM  [facility] [address] [place], [place], [address] Phone: [phone]   Fax:  [phone]  Name: Alicia Garcia MRN: [id number] Date of Birth: December 19, 1960

## 2016-03-26 ENCOUNTER — Encounter: Payer: 59 | Admitting: Physical Therapy

## 2016-04-01 ENCOUNTER — Ambulatory Visit: Payer: 59 | Admitting: *Deleted

## 2016-04-01 DIAGNOSIS — M25611 Stiffness of right shoulder, not elsewhere classified: Secondary | ICD-10-CM

## 2016-04-01 DIAGNOSIS — M25511 Pain in right shoulder: Secondary | ICD-10-CM | POA: Diagnosis not present

## 2016-04-01 NOTE — Therapy (Signed)
[facility] [address] [place], [place], [address] Phone: [phone]   Fax:  [phone]  Physical Therapy Treatment  Patient Details  Name: Alicia Garcia MRN: [id number] Date of Birth: 02-Nov-1960 Referring Provider: Tawni PummelNaitik Panwala   Encounter Date: 04/01/2016      PT End of Session - 04/01/16 0832    Visit Number 6   Number of Visits 12   Date for PT Re-Evaluation 03/25/16   PT Start Time 0820   PT Stop Time 0911   PT Time Calculation (min) 51 min      Past Medical History:  Diagnosis Date  . Anemia   . Heart murmur    as a child  . Lupus [facility](HCC)     Past Surgical History:  Procedure Laterality Date  . COLONOSCOPY     2012, normal   . TONSILLECTOMY    . TUBAL LIGATION      There were no vitals filed for this visit.      Subjective Assessment - 04/01/16 0824    Subjective Patient continues to report improvement overall and did good after last treatment. Mainly hurts when reaching   Patient Stated Goals Use my right shoulder agin.   Currently in Pain? Yes   Pain Score 2    Pain Orientation Right   Pain Descriptors / Indicators Tightness;Sore   Pain Onset More than a month ago                         [facility]/Ex[facility] - 04/01/16 0001      Exercises   Exercises Shoulder     Shoulder Exercises: Standing   Other Standing Exercises UE Ranger circles each way 3 x 10   Other Standing Exercises ER yellow Tband 3x fatigue     Shoulder Exercises: Pulleys   Flexion 3 minutes     Manual Therapy   Manual Therapy Passive ROM   Passive ROM PROM/ AAROMto R shoulder into flex/scap/ER/IR with gentle holds at end range. ACJ cross-friction mass. Did well with Contract/relax for elevation ROM                  UBE at 90 RPMs x 5 mins             PT Short Term Goals - 03/03/16 0745      PT SHORT TERM GOAL #1   Title Independent with a HEP.   Time 8   Period Weeks   Status  Achieved     PT SHORT TERM GOAL #2   Title Active right shoulder flexion to 150 degrees so the patient can easily reach overhead   Time 8   Period Weeks   Status On-going  AROM 138 degrees 03/03/16     PT SHORT TERM GOAL #3   Title Active ER to 80 degrees+ to allow for easily donning/doffing of apparel   Time 8   Period Weeks   Status On-going  AROM 45 degrees 03/03/16     PT SHORT TERM GOAL #4   Title Patient able to reach behind back to L2 with right hand.   Time 8   Period Weeks   Status On-going     PT SHORT TERM GOAL #5   Title Increase right shoulder strength to a solid 4+/5 to increase stability for performance of functional activities   Time 8   Period Weeks   Status On-going  Plan - 04/01/16 1610    Clinical Impression Statement Pt did great with Rx today and was able to perform  RT shldr exs with minimal increase in pain. She did well again with contract- relax technique to increase ROM. She was able to reach 152 for flexion, 72 ER, and 70 for IR. Goals are ongoing   Rehab Potential Excellent   PT Frequency 2x / week   PT Duration 8 weeks   PT Treatment/Interventions ADLs/Self Care Home Management;Therapeutic activities;Therapeutic exercise;Neuromuscular re-education;Patient/family education;Passive range of motion;Manual techniques   PT Next Visit Plan Continue R shoulder ROM esp shoulder flexion and progress to strengthening as symptoms dictate per MPT POC   Consulted and Agree with Plan of Care Patient      Patient will benefit from skilled therapeutic intervention in order to improve the following deficits and impairments:  Pain, Decreased activity tolerance, Decreased strength, Decreased range of motion  Visit Diagnosis: Pain in right shoulder  Stiffness of right shoulder, not elsewhere classified     Problem List There are no active problems to display for this patient.   Luke Falero,CHRIS, PTA 04/01/2016, 12:03 PM  [facility] [address] [place], [place], [address] Phone: [phone]   Fax:  [phone]  Name: Alicia Garcia MRN: [id number] Date of Birth: 11/23/1960

## 2016-04-08 ENCOUNTER — Ambulatory Visit: Payer: 59 | Admitting: *Deleted

## 2016-04-08 DIAGNOSIS — M25611 Stiffness of right shoulder, not elsewhere classified: Secondary | ICD-10-CM

## 2016-04-08 DIAGNOSIS — M25511 Pain in right shoulder: Secondary | ICD-10-CM

## 2016-04-08 NOTE — Therapy (Signed)
[facility] [address] [place], [place], [address] Phone: [phone]   Fax:  [phone]  Physical Therapy Treatment  Patient Details  Name: Alicia Garcia MRN: [id number] Date of Birth: 22-Apr-1961 Referring Provider: Tawni Pummel   Encounter Date: 04/08/2016      PT End of Session - 04/08/16 0833    Visit Number 7   Number of Visits 24   Date for PT Re-Evaluation 05/20/16   PT Start Time 0825   PT Stop Time 0911   PT Time Calculation (min) 46 min      Past Medical History:  Diagnosis Date  . Anemia   . Heart murmur    as a child  . Lupus [facility])     Past Surgical History:  Procedure Laterality Date  . COLONOSCOPY     2012, normal   . TONSILLECTOMY    . TUBAL LIGATION      There were no vitals filed for this visit.      Subjective Assessment - 04/08/16 0828    Subjective (P)  RT shldr is doing better.  3/10 with reaching. MD appt NOV.     Patient Stated Goals Use my right shoulder agin.   Pain Score 3    Pain Orientation Right   Pain Descriptors / Indicators Tightness;Sore   Pain Onset More than a month ago   Pain Frequency Intermittent            [facility] PT Assessment - 04/08/16 0001      Assessment   Medical Diagnosis Right shoulder pain.     ROM / Strength   AROM / PROM / Strength PROM;AROM     AROM   AROM Assessment Site Shoulder   Right/Left Shoulder Right   Right Shoulder Flexion 140 Degrees   Right Shoulder Internal Rotation 70 Degrees  HBB L3   Right Shoulder External Rotation 62 Degrees     PROM   PROM Assessment Site Shoulder   Right/Left Shoulder Right   Right Shoulder Flexion 148 Degrees   Right Shoulder Internal Rotation 80 Degrees   Right Shoulder External Rotation 80 Degrees                     [facility] Adult PT Treatment/Exercise - 04/08/16 0001      Exercises   Exercises Shoulder     Shoulder Exercises: Standing   Other Standing Exercises UE Ranger circles  each way 3 x 10   Other Standing Exercises ER yellow Tband 3x fatigue     Shoulder Exercises: Pulleys   Flexion 3 minutes     Manual Therapy   Manual Therapy Passive ROM   Passive ROM PROM/ AAROMto R shoulder into flex/scap/ER/IR with gentle holds at end range. ACJ cross-friction mass. Did well with Contract/relax for elevation ROM                  PT Short Term Goals - 04/08/16 0913      PT SHORT TERM GOAL #1   Title Independent with a HEP.   Time 8   Period Weeks   Status Achieved     PT SHORT TERM GOAL #2   Title Active right shoulder flexion to 150 degrees so the patient can easily reach overhead   Period Weeks   Status On-going     PT SHORT TERM GOAL #3   Title Active ER to 80 degrees+ to allow for easily donning/doffing of apparel   Time 8  Period Weeks   Status On-going     PT SHORT TERM GOAL #4   Title Patient able to reach behind back to L2 with right hand.   Time 8   Period Weeks   Status On-going     PT SHORT TERM GOAL #5   Title Increase right shoulder strength to a solid 4+/5 to increase stability for performance of functional activities   Time 8   Period Weeks   Status On-going                  Plan - 04/08/16 [id number]    Clinical Impression Statement Pt did fairly well with Rx today and continues to progress towards goals. She was unable to reach AROM LTGs yet due to tightness in RT shldr. Her strength has improved to 4/5 and is close to meeting her LTG for it as well. Her CC is pain while reaching out sometimes.. Pt want's to continue with PT to meet goals.   Rehab Potential Excellent   PT Frequency 2x / week   PT Duration 8 weeks   PT Treatment/Interventions ADLs/Self Care Home Management;Therapeutic activities;Therapeutic exercise;Neuromuscular re-education;Patient/family education;Passive range of motion;Manual techniques   PT Next Visit Plan Continue R shoulder ROM esp shoulder flexion and progress to strengthening as symptoms  dictate per MPT POC   Consulted and Agree with Plan of Care Patient      Patient will benefit from skilled therapeutic intervention in order to improve the following deficits and impairments:  Pain, Decreased activity tolerance, Decreased strength, Decreased range of motion  Visit Diagnosis: Pain in right shoulder - Plan: PT plan of care cert/re-cert  Stiffness of right shoulder, not elsewhere classified - Plan: PT plan of care cert/re-cert     Problem List There are no active problems to display for this patient.   Malikye Reppond, [place] MPT 04/08/2016, 10:32 AM  [facility] [address] [place], [place], [address] Phone: [phone]   Fax:  [phone]  Name: Alicia Garcia MRN: [id number] Date of Birth: 12-28-1960

## 2016-04-15 ENCOUNTER — Encounter: Payer: 59 | Admitting: Physical Therapy

## 2016-04-16 ENCOUNTER — Ambulatory Visit: Payer: 59 | Attending: Rheumatology | Admitting: Physical Therapy

## 2016-04-16 ENCOUNTER — Encounter: Payer: Self-pay | Admitting: Physical Therapy

## 2016-04-16 DIAGNOSIS — M25511 Pain in right shoulder: Secondary | ICD-10-CM

## 2016-04-16 DIAGNOSIS — M25611 Stiffness of right shoulder, not elsewhere classified: Secondary | ICD-10-CM | POA: Diagnosis present

## 2016-04-16 NOTE — Therapy (Signed)
[facility] [address] [place], [place], [address] Phone: [phone]   Fax:  [phone]  Physical Therapy Treatment  Patient Details  Name: Alicia Garcia MRN: [id number] Date of Birth: 08-03-1960 Referring Provider: Tawni Pummel   Encounter Date: 04/16/2016      PT End of Session - 04/16/16 0815    Visit Number 8   Number of Visits 24   Date for PT Re-Evaluation 05/20/16   PT Start Time 0731   PT Stop Time 0813   PT Time Calculation (min) 42 min   Activity Tolerance Patient tolerated treatment well   Behavior During Therapy [facility] for tasks assessed/performed      Past Medical History:  Diagnosis Date  . Anemia   . Heart murmur    as a child  . Lupus     Past Surgical History:  Procedure Laterality Date  . COLONOSCOPY     2012, normal   . TONSILLECTOMY    . TUBAL LIGATION      There were no vitals filed for this visit.      Subjective Assessment - 04/16/16 0735    Subjective Patient continues to respond well to therapy with no reported pain today   Patient Stated Goals Use my right shoulder agin.   Currently in Pain? No/denies            [facility] PT Assessment - 04/16/16 0001      AROM   AROM Assessment Site Shoulder   Right/Left Shoulder Right   Right Shoulder Flexion 143 Degrees   Right Shoulder Internal Rotation --  L1   Right Shoulder External Rotation 68 Degrees     PROM   PROM Assessment Site Shoulder   Right/Left Shoulder Right   Right Shoulder Flexion 146 Degrees   Right Shoulder External Rotation 70 Degrees                     [facility] Adult PT Treatment/Exercise - 04/16/16 0001      Shoulder Exercises: Standing   Other Standing Exercises UE Ranger for elevation/circles each way 3 x 10   Other Standing Exercises ER yellow Tband 3x fatigue     Shoulder Exercises: Pulleys   Flexion Other (comment)      Shoulder Exercises: Stretch   Other Shoulder Stretches demo for  corner stretch and door stretch x3 each     Manual Therapy   Manual Therapy Passive ROM   Passive ROM PROM for right shoulder flexion/ER and ant cap stretch with low load holds                  PT Short Term Goals - 04/16/16 0816      PT SHORT TERM GOAL #1   Title Independent with a HEP.   Time 8   Period Weeks   Status Achieved     PT SHORT TERM GOAL #2   Title Active right shoulder flexion to 150 degrees so the patient can easily reach overhead   Time 8   Period Weeks   Status On-going     PT SHORT TERM GOAL #3   Title Active ER to 80 degrees+ to allow for easily donning/doffing of apparel   Time 8   Period Weeks   Status On-going     PT SHORT TERM GOAL #4   Title Patient able to reach behind back to L2 with right hand.   Time 8   Period Weeks  Status Achieved  L1 04/16/16     PT SHORT TERM GOAL #5   Title Increase right shoulder strength to a solid 4+/5 to increase stability for performance of functional activities   Time 8   Period Weeks   Status On-going                  Plan - 04/16/16 0817    Clinical Impression Statement Patient progressing with ROM slowly due to ongoing stiffness in right shoulder. Educated patient on daily stretching for all motions. Patient has improved IR and able to meet LTG# 4, others ongoing due to ROM,and strength deficits   Rehab Potential Excellent   PT Frequency 2x / week   PT Duration 8 weeks   PT Treatment/Interventions ADLs/Self Care Home Management;Therapeutic activities;Therapeutic exercise;Neuromuscular re-education;Patient/family education;Passive range of motion;Manual techniques   PT Next Visit Plan Continue R shoulder ROM esp shoulder flexion and progress to strengthening as symptoms dictate per MPT POC   Consulted and Agree with Plan of Care Patient      Patient will benefit from skilled therapeutic intervention in order to improve the following deficits and impairments:  Pain, Decreased activity  tolerance, Decreased strength, Decreased range of motion  Visit Diagnosis: Right shoulder pain, unspecified chronicity  Stiffness of right shoulder, not elsewhere classified     Problem List There are no active problems to display for this patient.   Hermelinda DellenDUNFORD, Julyan Gales P, PTA 04/16/2016, 8:23 AM  [facility] [address] [place], [place], [address] Phone: [phone]   Fax:  [phone]  Name: Alicia Garcia MRN: [id number] Date of Birth: 1960/07/19

## 2016-04-22 ENCOUNTER — Encounter: Payer: 59 | Admitting: Physical Therapy

## 2016-04-24 ENCOUNTER — Encounter: Payer: Self-pay | Admitting: Physical Therapy

## 2016-04-24 ENCOUNTER — Ambulatory Visit: Payer: 59 | Admitting: Physical Therapy

## 2016-04-24 DIAGNOSIS — M25511 Pain in right shoulder: Secondary | ICD-10-CM

## 2016-04-24 DIAGNOSIS — M25611 Stiffness of right shoulder, not elsewhere classified: Secondary | ICD-10-CM

## 2016-04-24 NOTE — Therapy (Signed)
[facility] [address] [place], [place], [address] Phone: [phone]   Fax:  [phone]  Physical Therapy Treatment  Patient Details  Name: Alicia Garcia MRN: [id number] Date of Birth: 08/03/1960 Referring Provider: Tawni PummelNaitik Panwala   Encounter Date: 04/24/2016      PT End of Session - 04/24/16 0744    Visit Number 9   Number of Visits 24   Date for PT Re-Evaluation 05/20/16   PT Start Time 0742   PT Stop Time 0822   PT Time Calculation (min) 40 min   Activity Tolerance Patient tolerated treatment well   Behavior During Therapy [facility] for tasks assessed/performed      Past Medical History:  Diagnosis Date  . Anemia   . Heart murmur    as a child  . Lupus     Past Surgical History:  Procedure Laterality Date  . COLONOSCOPY     2012, normal   . TONSILLECTOMY    . TUBAL LIGATION      There were no vitals filed for this visit.      Subjective Assessment - 04/24/16 0742    Subjective Reports that her shoulder is much better with only twinges of pain as she is sleeping on her shoulder. Reports that the stiffness has decreased and has been able to exercise more. Reports that doorway stretch seems to really be helping.   Patient Stated Goals Use my right shoulder agin.   Currently in Pain? Yes   Pain Score --  "only twinges"   Pain Location Shoulder   Pain Orientation Right   Pain Onset More than a month ago   Pain Frequency Intermittent            [facility] PT Assessment - 04/24/16 0001      Assessment   Medical Diagnosis Right shoulder pain.     Precautions   Precautions None     Restrictions   Weight Bearing Restrictions No                     [facility] Adult PT Treatment/Exercise - 04/24/16 0001      Shoulder Exercises: Supine   Other Supine Exercises R shoulder AROM D2 x20 reps  Reported popping and discomfort but continued     Shoulder Exercises: Standing   Horizontal ABduction  Strengthening;Both;20 reps;Theraband   Theraband Level (Shoulder Horizontal ABduction) Level 1 (Yellow)   External Rotation Strengthening;Right;Theraband  3x10 reps   Theraband Level (Shoulder External Rotation) Level 1 (Yellow)   Internal Rotation Strengthening;Right;Theraband  3x10 reps   Theraband Level (Shoulder Internal Rotation) Level 1 (Yellow)   Extension Strengthening;Both;20 reps;Theraband   Theraband Level (Shoulder Extension) Level 1 (Yellow)     Shoulder Exercises: Pulleys   Flexion Other (comment)  x5 min   Other Pulley Exercises UE ranger flex/circles x30 reps     Shoulder Exercises: ROM/Strengthening   UBE (Upper Arm Bike) 90 RPM x5 min (forward/backward)     Manual Therapy   Manual Therapy Passive ROM   Passive ROM PROM of R shoulder into flex/ER/IR with gentle holds at end range                  PT Short Term Goals - 04/16/16 [id number]      PT SHORT TERM GOAL #1   Title Independent with a HEP.   Time 8   Period Weeks   Status Achieved     PT SHORT TERM GOAL #2  Title Active right shoulder flexion to 150 degrees so the patient can easily reach overhead   Time 8   Period Weeks   Status On-going     PT SHORT TERM GOAL #3   Title Active ER to 80 degrees+ to allow for easily donning/doffing of apparel   Time 8   Period Weeks   Status On-going     PT SHORT TERM GOAL #4   Title Patient able to reach behind back to L2 with right hand.   Time 8   Period Weeks   Status Achieved  L1 04/16/16     PT SHORT TERM GOAL #5   Title Increase right shoulder strength to a solid 4+/5 to increase stability for performance of functional activities   Time 8   Period Weeks   Status On-going                  Plan - 04/24/16 0750    Clinical Impression Statement Patient arrived to treatment with only minimal intermittant R shoulder pain per patient report with improvements noted by patient. Patient only verablized discomfort and R shoulder popping  with supine R shoulder AROM D2 strengthening but was able to continue exercise. Patient required minimal multimodal cueing for proper technique. Increased difficulty noted with horizontal abduction with yellow theraband. Firm end feels and smooth arc of motion noted with PROM of R shoulder with flexion, ER, and IR. Patient had no reports of pain with PROM of R shoulder.   Rehab Potential Excellent   PT Frequency 2x / week   PT Duration 8 weeks   PT Treatment/Interventions ADLs/Self Care Home Management;Therapeutic activities;Therapeutic exercise;Neuromuscular re-education;Patient/family education;Passive range of motion;Manual techniques   PT Next Visit Plan Continue R shoulder ROM esp shoulder flexion and progress to strengthening as symptoms dictate per MPT POC. Continue with periscapular strengthening per weakness/difficulty.   Consulted and Agree with Plan of Care Patient      Patient will benefit from skilled therapeutic intervention in order to improve the following deficits and impairments:  Pain, Decreased activity tolerance, Decreased strength, Decreased range of motion  Visit Diagnosis: Right shoulder pain, unspecified chronicity  Stiffness of right shoulder, not elsewhere classified     Problem List There are no active problems to display for this patient.   Evelene Croon, PTA 04/24/2016, 8:28 AM  [facility] [address] [place], [place], [address] Phone: [phone]   Fax:  [phone]  Name: Alicia Garcia MRN: [id number] Date of Birth: 20-Jan-1961

## 2016-04-28 ENCOUNTER — Ambulatory Visit: Payer: 59 | Admitting: Physical Therapy

## 2016-04-28 ENCOUNTER — Encounter: Payer: Self-pay | Admitting: Physical Therapy

## 2016-04-28 DIAGNOSIS — M25511 Pain in right shoulder: Secondary | ICD-10-CM | POA: Diagnosis not present

## 2016-04-28 DIAGNOSIS — M25611 Stiffness of right shoulder, not elsewhere classified: Secondary | ICD-10-CM

## 2016-04-28 NOTE — Therapy (Signed)
[facility] [address] [place], [place], [address] Phone: [phone]   Fax:  [phone]  Physical Therapy Treatment  Patient Details  Name: Alicia Garcia MRN: [id number] Date of Birth: 27-Nov-1960 Referring Provider: Tawni Pummel   Encounter Date: 04/28/2016      PT End of Session - 04/28/16 0815    Visit Number 10   Number of Visits 24   Date for PT Re-Evaluation 05/20/16   PT Start Time 0733   PT Stop Time 0815   PT Time Calculation (min) 42 min   Activity Tolerance Patient tolerated treatment well   Behavior During Therapy [facility] for tasks assessed/performed      Past Medical History:  Diagnosis Date  . Anemia   . Heart murmur    as a child  . Lupus     Past Surgical History:  Procedure Laterality Date  . COLONOSCOPY     2012, normal   . TONSILLECTOMY    . TUBAL LIGATION      There were no vitals filed for this visit.      Subjective Assessment - 04/28/16 0740    Subjective Patient shoulder still a little sore after sleeping on it   Patient Stated Goals Use my right shoulder agin.   Currently in Pain? Yes   Pain Score 1    Pain Location Shoulder   Pain Orientation Right   Pain Descriptors / Indicators --  twinge   Pain Onset More than a month ago   Pain Frequency Intermittent   Aggravating Factors  certain movements   Pain Relieving Factors at rest            [facility] PT Assessment - 04/28/16 0001      AROM   AROM Assessment Site Shoulder   Right/Left Shoulder Right   Right Shoulder Flexion 145 Degrees   Right Shoulder External Rotation 74 Degrees     PROM   PROM Assessment Site Shoulder   Right/Left Shoulder Right   Right Shoulder Flexion 150 Degrees   Right Shoulder External Rotation 78 Degrees                     [facility] Adult PT Treatment/Exercise - 04/28/16 0001      Shoulder Exercises: Standing   Protraction Strengthening;Right;Theraband  3x10   Theraband Level  (Shoulder Protraction) Level 1 (Yellow)   External Rotation Strengthening;Right;Theraband  3x10   Theraband Level (Shoulder External Rotation) Level 1 (Yellow)   Internal Rotation Strengthening;Right;Theraband  3x10   Theraband Level (Shoulder Internal Rotation) Level 1 (Yellow)   Extension Strengthening;Both;Theraband  3x10   Theraband Level (Shoulder Extension) Level 1 (Yellow)   Other Standing Exercises standing wall slide with eccentric lowering x5     Shoulder Exercises: Pulleys   Flexion Other (comment)    Other Pulley Exercises UE ranger flex/circles x30 reps     Shoulder Exercises: ROM/Strengthening   UBE (Upper Arm Bike) 90 RPM x6 min (forward/backward)     Manual Therapy   Manual Therapy Passive ROM   Passive ROM PROM of R shoulder into flex/ER/IR with gentle holds at end range                  PT Short Term Goals - 04/28/16 6962      PT SHORT TERM GOAL #1   Title Independent with a HEP.   Time 8   Period Weeks   Status Achieved     PT SHORT TERM  GOAL #2   Title Active right shoulder flexion to 150 degrees so the patient can easily reach overhead   Time 8   Period Weeks   Status On-going  AROM 145 degrees 04/28/16     PT SHORT TERM GOAL #3   Title Active ER to 80 degrees+ to allow for easily donning/doffing of apparel   Time 8   Period Weeks   Status On-going  AROM 74 degrees 04/28/16     PT SHORT TERM GOAL #4   Title Patient able to reach behind back to L2 with right hand.   Time 8   Period Weeks   Status Achieved     PT SHORT TERM GOAL #5   Title Increase right shoulder strength to a solid 4+/5 to increase stability for performance of functional activities   Time 8   Period Weeks   Status On-going                  Plan - 04/28/16 0817    Clinical Impression Statement Patient progressing this week with little pain overall reported and improved PROM and AROM for right shoulder flexion and ER. Patient continues to  perform self stretches daily and t-band exercises as directed. Patient only reports soreness mild when sleeping on right side or certain movements cause a minor twinge. Patient progressing toward remaining goals due to full AROM and strength limitations.   Rehab Potential Excellent   PT Frequency 2x / week   PT Duration 8 weeks   PT Treatment/Interventions ADLs/Self Care Home Management;Therapeutic activities;Therapeutic exercise;Neuromuscular re-education;Patient/family education;Passive range of motion;Manual techniques   PT Next Visit Plan Continue R shoulder ROM esp shoulder flexion and progress to strengthening as symptoms dictate per MPT POC. Continue with periscapular strengthening per weakness/difficulty.   Consulted and Agree with Plan of Care Patient      Patient will benefit from skilled therapeutic intervention in order to improve the following deficits and impairments:  Pain, Decreased activity tolerance, Decreased strength, Decreased range of motion  Visit Diagnosis: Right shoulder pain, unspecified chronicity  Stiffness of right shoulder, not elsewhere classified       Problem List There are no active problems to display for this patient.   APPLEGATE, ItalyHAD, PTA 04/28/2016, 5:17 PM  Patient is progressing well toward goals.  Please see above.  Italyhad Applegate MPT  [facility] [address] [place], [place], [address] Phone: [phone]   Fax:  [phone]  Name: Alicia LawmanBeatrice M Garcia MRN: [id number] Date of Birth: [birth year]

## 2016-05-15 ENCOUNTER — Ambulatory Visit: Payer: 59 | Attending: Rheumatology | Admitting: Physical Therapy

## 2016-05-15 ENCOUNTER — Encounter: Payer: Self-pay | Admitting: Physical Therapy

## 2016-05-15 DIAGNOSIS — M25511 Pain in right shoulder: Secondary | ICD-10-CM | POA: Diagnosis present

## 2016-05-15 DIAGNOSIS — M25611 Stiffness of right shoulder, not elsewhere classified: Secondary | ICD-10-CM | POA: Insufficient documentation

## 2016-05-15 NOTE — Therapy (Addendum)
[facility] [place], [place], [address] Phone: [phone]   Fax:  [phone]  Physical Therapy Treatment  Patient Details  Name: RYELEE Garcia MRN: [id number] Date of Birth: [birth year] Referring Provider: Eliezer Lofts   Encounter Date: 05/15/2016      PT End of Session - 05/15/16 0746    Visit Number 11   Number of Visits 24   Date for PT Re-Evaluation 05/20/16   PT Start Time 0741   PT Stop Time 0816   PT Time Calculation (min) 35 min   Activity Tolerance Patient tolerated treatment well   Behavior During Therapy [facility] for tasks assessed/performed      Past Medical History:  Diagnosis Date  . Anemia   . Heart murmur    as a child  . Lupus     Past Surgical History:  Procedure Laterality Date  . COLONOSCOPY     2012, normal   . TONSILLECTOMY    . TUBAL LIGATION      There were no vitals filed for this visit.      Subjective Assessment - 05/15/16 0743    Subjective Reports that two weekends ago she went to the pool and did a lot of back floating that aggravated her shoulder. Reports for several days her shoulder pain got worse but has decreased now.   Patient Stated Goals Use my right shoulder agin.   Currently in Pain? Yes   Pain Score 4    Pain Location Shoulder   Pain Orientation Right   Pain Descriptors / Indicators Sore   Pain Onset More than a month ago            [facility] PT Assessment - 05/15/16 0001      Assessment   Medical Diagnosis Right shoulder pain.   Next MD Visit 05/2016     Precautions   Precautions None     Restrictions   Weight Bearing Restrictions No                     [facility] Adult PT Treatment/Exercise - 05/15/16 0001      Shoulder Exercises: Supine   Horizontal ABduction Strengthening;Both;20 reps;Theraband   Theraband Level (Shoulder Horizontal ABduction) Level 1 (Yellow)   External Rotation Strengthening;Both;20 reps;Theraband   Theraband  Level (Shoulder External Rotation) Level 1 (Yellow)   Other Supine Exercises R shoulder D1 and D2 yellow theraband strengthening 2x10 reps each   Other Supine Exercises R shoulder lightly resisted D1, D2 strengthening 2x10 reps each     Shoulder Exercises: Standing   Protraction Strengthening;Right;Theraband   Theraband Level (Shoulder Protraction) Level 1 (Yellow)   Protraction Limitations 3x10 reps   External Rotation Strengthening;Right;Theraband  3x30 reps   Theraband Level (Shoulder External Rotation) Level 1 (Yellow)   Internal Rotation Strengthening;Right;Theraband  3*10 reps   Theraband Level (Shoulder Internal Rotation) Level 1 (Yellow)   Extension Strengthening;Both;Theraband   Theraband Level (Shoulder Extension) Level 1 (Yellow)   Extension Limitations 3x10 reps     Shoulder Exercises: Pulleys   Flexion Other (comment)  x5 min   Other Pulley Exercises UE ranger flex/circles x30 reps     Shoulder Exercises: ROM/Strengthening   UBE (Upper Arm Bike) 120 RPM x6 min (forward/backward)                  PT Short Term Goals - 04/28/16 0816      PT SHORT TERM GOAL #1   Title Independent with a  HEP.   Time 8   Period Weeks   Status Achieved     PT SHORT TERM GOAL #2   Title Active right shoulder flexion to 150 degrees so the patient can easily reach overhead   Time 8   Period Weeks   Status On-going  AROM 145 degrees 04/28/16     PT SHORT TERM GOAL #3   Title Active ER to 80 degrees+ to allow for easily donning/doffing of apparel   Time 8   Period Weeks   Status On-going  AROM 74 degrees 04/28/16     PT SHORT TERM GOAL #4   Title Patient able to reach behind back to L2 with right hand.   Time 8   Period Weeks   Status Achieved     PT SHORT TERM GOAL #5   Title Increase right shoulder strength to a solid 4+/5 to increase stability for performance of functional activities   Time 8   Period Weeks   Status On-going                   Plan - 05/15/16 0820    Clinical Impression Statement Patient arrived to treatment with reports of R shoulder aggravation due to swimming. Patient able to complete exercises as directed with reports of discomfort with UE ranger as well as lightly resisted R shoulder D1 strengthening. Patient reports irritation with shoulder flexion with elbow flexion such as reaching at times or laying on R shoulder too long. Patient educated to continue HEP at home until MD appt.   Rehab Potential Excellent   PT Frequency 2x / week   PT Duration 8 weeks   PT Treatment/Interventions ADLs/Self Care Home Management;Therapeutic activities;Therapeutic exercise;Neuromuscular re-education;Patient/family education;Passive range of motion;Manual techniques   PT Next Visit Plan Place chart on hold with patient encouraged to complete HEP at home until MD appt.   Consulted and Agree with Plan of Care Patient      Patient will benefit from skilled therapeutic intervention in order to improve the following deficits and impairments:  Pain, Decreased activity tolerance, Decreased strength, Decreased range of motion  Visit Diagnosis: Right shoulder pain, unspecified chronicity  Stiffness of right shoulder, not elsewhere classified     Problem List There are no active problems to display for this patient.   Wynelle Fanny, PTA 05/15/2016, 8:24 AM  [facility] [address] [place], [place], [address] Phone: [phone]   Fax:  [phone]  Name: Alicia Garcia MRN: [id number] Date of Birth: 1960/08/20 PHYSICAL THERAPY DISCHARGE SUMMARY  Visits from Start of Care: 11.  Current functional level related to goals / functional outcomes: See above.   Remaining deficits: Good progress but continued loss of right shoulder ROM and strength.   Education / Equipment: HEP. Plan: Patient agrees to discharge.  Patient goals were partially met. Patient is being  discharged due to being pleased with the current functional level.  ?????        Mali Applegate MPT

## 2016-06-17 ENCOUNTER — Ambulatory Visit: Payer: 59 | Admitting: Rheumatology

## 2016-06-23 NOTE — Progress Notes (Deleted)
   Office Visit Note  Patient: Alicia Garcia             Date of Birth: Mar 15, 1961           MRN: [id number]             PCP: No primary care provider on file. Referring: No ref. provider found Visit Date: 06/27/2016 Occupation: @GUAROCC @    Subjective:  No chief complaint on file.   History of Present Illness: Alicia LawmanBeatrice M Divis is a 55 y.o. female ***   Activities of Daily Living:  Patient reports morning stiffness for *** {minute/hour:19697}.   Patient {ACTIONS;DENIES/REPORTS:21021675::"Denies"} nocturnal pain.  Difficulty dressing/grooming: {ACTIONS;DENIES/REPORTS:21021675::"Denies"} Difficulty climbing stairs: {ACTIONS;DENIES/REPORTS:21021675::"Denies"} Difficulty getting out of chair: {ACTIONS;DENIES/REPORTS:21021675::"Denies"} Difficulty using hands for taps, buttons, cutlery, and/or writing: {ACTIONS;DENIES/REPORTS:21021675::"Denies"}   No Rheumatology ROS completed.   PMFS History:  There are no active problems to display for this patient.   Past Medical History:  Diagnosis Date  . Anemia   . Heart murmur    as a child  . Lupus     Family History  Problem Relation Age of Onset  . Heart disease Brother    Past Surgical History:  Procedure Laterality Date  . COLONOSCOPY     2012, normal   . TONSILLECTOMY    . TUBAL LIGATION     Social History   Social History Narrative  . No narrative on file     Objective: Vital Signs: There were no vitals taken for this visit.   Physical Exam   Musculoskeletal Exam: ***  CDAI Exam: No CDAI exam completed.    Investigation: No additional findings.   Imaging: No results found.  Speciality Comments: No specialty comments available.    Procedures:  No procedures performed Allergies: Patient has no known allergies.   Assessment / Plan:     Visit Diagnoses: No diagnosis found.    Orders: No orders of the defined types were placed in this encounter.  No orders of the defined types were placed  in this encounter.   Face-to-face time spent with patient was *** minutes. 50% of time was spent in counseling and coordination of care.  Follow-Up Instructions: No Follow-up on file.   Tawni PummelNaitik Bilbo Carcamo, PA-C

## 2016-06-24 DIAGNOSIS — M329 Systemic lupus erythematosus, unspecified: Secondary | ICD-10-CM | POA: Insufficient documentation

## 2016-06-24 DIAGNOSIS — M170 Bilateral primary osteoarthritis of knee: Secondary | ICD-10-CM | POA: Insufficient documentation

## 2016-06-24 DIAGNOSIS — E559 Vitamin D deficiency, unspecified: Secondary | ICD-10-CM | POA: Insufficient documentation

## 2016-06-27 ENCOUNTER — Ambulatory Visit: Payer: 59 | Admitting: Rheumatology

## 2016-07-29 ENCOUNTER — Ambulatory Visit: Payer: 59 | Admitting: Rheumatology

## 2016-08-05 ENCOUNTER — Ambulatory Visit: Payer: 59 | Admitting: Rheumatology

## 2016-08-13 ENCOUNTER — Ambulatory Visit (INDEPENDENT_AMBULATORY_CARE_PROVIDER_SITE_OTHER): Payer: 59 | Admitting: Rheumatology

## 2016-08-13 ENCOUNTER — Encounter: Payer: Self-pay | Admitting: Rheumatology

## 2016-08-13 VITALS — BP 122/78 | HR 76 | Ht 66.0 in | Wt 161.0 lb

## 2016-08-13 DIAGNOSIS — E559 Vitamin D deficiency, unspecified: Secondary | ICD-10-CM

## 2016-08-13 DIAGNOSIS — D569 Thalassemia, unspecified: Secondary | ICD-10-CM | POA: Diagnosis not present

## 2016-08-13 DIAGNOSIS — R769 Abnormal immunological finding in serum, unspecified: Secondary | ICD-10-CM

## 2016-08-13 DIAGNOSIS — M3213 Lung involvement in systemic lupus erythematosus: Secondary | ICD-10-CM | POA: Diagnosis not present

## 2016-08-13 DIAGNOSIS — Z79899 Other long term (current) drug therapy: Secondary | ICD-10-CM | POA: Diagnosis not present

## 2016-08-13 NOTE — Progress Notes (Signed)
Office Visit Note  Patient: Alicia Garcia             Date of Birth: March 06, 1961           MRN: [id number]             PCP: Alva Garnet., MD Referring: No ref. provider found Visit Date: 08/13/2016 Occupation: @GUAROCC @    Subjective:  Follow-up Follow-up on lupus.  History of Present Illness: Alicia Garcia is a 56 y.o. female  Last seen 12/27/2015 Patient's lupus is doing well. No oral ulcers nasal ulcers fatigue rashes. She's not on any medication for lupus at this time since she is not actively having any symptoms.  The last labs that we did was June 2017 at which time only her anti-cardiolipin IgM was positive and shortlytake enteric-coated aspirin. She is aware of looking for the symptoms and letting us know if anything arises.  She has a history of OA of the knee joint and has occasional joint stiffness. She also carries a history of thalassemia. She does not need any treatment for that. She has mild anemia as a result.   Activities of Daily Living:  Patient reports morning stiffness for 15 minutes.   Patient Denies nocturnal pain.  Difficulty dressing/grooming: Denies Difficulty climbing stairs: Denies Difficulty getting out of chair: Denies Difficulty using hands for taps, buttons, cutlery, and/or writing: Denies   Review of Systems  Constitutional: Negative for fatigue.  HENT: Negative for mouth sores and mouth dryness.   Eyes: Negative for dryness.  Respiratory: Negative for shortness of breath.   Gastrointestinal: Negative for constipation and diarrhea.  Musculoskeletal: Negative for myalgias and myalgias.  Skin: Negative for sensitivity to sunlight.  Psychiatric/Behavioral: Negative for decreased concentration and sleep disturbance.    PMFS History:  Patient Active Problem List   Diagnosis Date Noted  . 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 Vitamin D deficiency 06/24/2016  . Systemic lupus erythematosus (HCC) 06/24/2016  . Primary  osteoarthritis of both knees 06/24/2016    Past Medical History:  Diagnosis Date  . Anemia   . Heart murmur    as a child  . Lupus     Family History  Problem Relation Age of Onset  . Heart disease Brother    Past Surgical History:  Procedure Laterality Date  . COLONOSCOPY     2012, normal   . TONSILLECTOMY    . TUBAL LIGATION     Social History   Social History Narrative  . No narrative on file     Objective: Vital Signs: BP 122/78   Pulse 76   Ht 5\' 6"  (1.676 m)   Wt 161 lb (73 kg)   LMP 06/02/2016 (Approximate)   BMI 25.99 kg/m    Physical Exam  Constitutional: She is oriented to person, place, and time. She appears well-developed and well-nourished.  HENT:  Head: Normocephalic and atraumatic.  Eyes: EOM are normal. Pupils are equal, round, and reactive to light.  Cardiovascular: Normal rate, regular rhythm and normal heart sounds.  Exam reveals no gallop and no friction rub.   No murmur heard. Pulmonary/Chest: Effort normal and breath sounds normal. She has no wheezes. She has no rales.  Abdominal: Soft. Bowel sounds are normal. She exhibits no distension. There is no tenderness. There is no guarding. No hernia.  Musculoskeletal: Normal range of motion. She exhibits no edema, tenderness or deformity.  Lymphadenopathy:    She has no cervical adenopathy.  Neurological: She is alert and oriented  to person, place, and time. Coordination normal.  Skin: Skin is warm and dry. Capillary refill takes less than 2 seconds. No rash noted.  Psychiatric: She has a normal mood and affect. Her behavior is normal.     Musculoskeletal Exam:  Full range of motion of all joints grip strength is equal and strong bilaterally For myalgia tender points are all absent  CDAI Exam: CDAI Homunculus Exam:   Joint Counts:  CDAI Tender Joint count: 0 CDAI Swollen Joint count: 0  Global Assessments:  Patient Global Assessment: 1 Provider Global Assessment: 1  CDAI Calculated  Score: 2  No synovitis no synovitis on examination  Investigation: No additional findings. No visits with results within 6 Month(s) from this visit.  Latest known visit with results is:  Abstract on 03/01/2015  Component Date Value Ref Range Status  . HM Mammogram 02/22/2015 no mammographic evidence of malignancy. Routine evaluation in 1 year is recommended   Final   [facility] Autoimmune workup was done June 2017. ANA was positive with a titer of 1:80. Speckled pattern. Anti-cardiolipin IgM was positive. Patient is on enteric-coated aspirin Line C3-C4 normal Double-stranded DNA was negative on this one but was positive in the past.   Imaging: No results found.  Speciality Comments: No specialty comments available.    Procedures:  No procedures performed Allergies: Patient has no known allergies.   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 She did not have an on last visit nor on this visit.  Ongoing knee stiffness and we discussed Visco supplementation when needed but at this time she does not need it and she will let us know if there is a issue.  She'll require the following labs on her follow-up visit in 4 months. CBC with differential, CMP with GFR, urinalysis, anti-cartilage, C3-C4, double-stranded DNA, ANA with titer, sedimentation rate. Orders: No orders of the defined types were placed in this encounter.  No orders of the defined types were placed in this encounter.   Face-to-face time spent with patient was 30 minutes. 50% of time was spent in counseling and coordination of care.  Follow-Up Instructions: Return  in about 4 months (around 12/11/2016).   Tawni PummelNaitik Alayne Estrella, PA-C I examined and evaluated the patient with Tawni PummelNaitik Naeema Patlan PA. Patient has no synovitis on examination on my exam today. The plan of care was discussed as noted above.  Pollyann SavoyShaili Deveshwar, MD Note - This record has been created using Animal nutrition[employer]Dragon software.  Chart creation errors have been sought, but may not always  have been located. Such creation errors do not reflect on  the standard of medical care.

## 2016-08-13 NOTE — Patient Instructions (Signed)

## 2016-09-19 ENCOUNTER — Ambulatory Visit (INDEPENDENT_AMBULATORY_CARE_PROVIDER_SITE_OTHER): Payer: 59

## 2016-09-19 ENCOUNTER — Ambulatory Visit (INDEPENDENT_AMBULATORY_CARE_PROVIDER_SITE_OTHER): Payer: Worker's Compensation | Admitting: Physician Assistant

## 2016-09-19 VITALS — BP 119/74 | HR 63 | Temp 97.5°F | Resp 18 | Ht 66.0 in | Wt 167.0 lb

## 2016-09-19 DIAGNOSIS — S93402A Sprain of unspecified ligament of left ankle, initial encounter: Secondary | ICD-10-CM

## 2016-09-19 DIAGNOSIS — M25572 Pain in left ankle and joints of left foot: Secondary | ICD-10-CM

## 2016-09-19 NOTE — Progress Notes (Signed)
Alicia Garcia Dec 05, 1960 56 y.o.   Chief Complaint  Patient presents with  . Foot Injury    fell at work and hurt left foot  . Ankle Injury    fell at work and hurt left ankle    Presents for evaluation of work-related complaint.  Date of Injury: 09/15/2016  History of Present Illness: Pt is a 56 year old female who presents to PCP for left ankle pain x 5 days. She was walking down a flight of stairs when she missed the last step. This occurred at the end of her shit. Left ankle twisted inward. Immediate swelling. Swelling got worse later that night.   She wrapped it up with an ace wrap, elevated and iced it. The swelling went down. Pain decreased.   Today she c/o pain and soreness. Pain at the time of incident was 8/10 pain, today her pain is 5/10. Endorses reduced range of motion and swelling.  She is able to bear weight, however walks with a limp. Pain increases with pointing her foot and when wearing high heels. She has not worn heels due to pain.  She did not keep it wrapped in ace wrap nor did she elevate it this week. She has taken Tylenol extra strength - helped a lot.   Denies bruising, n/t, weakness, decreased sensation.   Review of Systems  Constitutional: Negative for chills, diaphoresis and malaise/fatigue.  Cardiovascular: Negative for chest pain, palpitations, claudication and leg swelling.  Musculoskeletal: Positive for joint pain (left ankle).  Skin: Negative.   Neurological: Negative for tingling, sensory change, focal weakness and weakness.    Current medications and allergies reviewed and updated. Past medical history, family history, social history have been reviewed and updated.   Physical Exam  Constitutional: She is oriented to person, place, and time and well-developed, well-nourished, and in no distress. No distress.  Cardiovascular: Normal rate, regular rhythm and normal heart sounds.   Musculoskeletal:       Left ankle: She exhibits  swelling. She exhibits normal range of motion, no ecchymosis and no deformity. Tenderness. Lateral malleolus and head of 5th metatarsal tenderness found. No medial malleolus, no AITFL and no proximal fibula tenderness found.  Dependant swelling medial foot.  Pain with plantar flexion and abduction.   Neurological: She is alert and oriented to person, place, and time. GCS score is 15.  Skin: Skin is warm and dry.  Psychiatric: Mood, memory, affect and judgment normal.  Vitals reviewed.  Dg Ankle Complete Left  Addendum Date: 09/19/2016   ADDENDUM REPORT: 09/19/2016 23:04 CLINICAL DATA:  56 y/o F; inversion twisting injury with tenderness to palpation of the distal fifth metatarsal bone. EXAM: Left foot complete 3+ views COMPARISON:  Concurrent ankle radiographs. FINDINGS: No acute fracture or dislocation is identified. Lisfranc alignment is maintained. No focal soft tissue abnormality. IMPRESSION: Negative. Electronically Signed   By: Mitzi Hansen M.D.   On: 09/19/2016 23:04   Result Date: 09/19/2016 CLINICAL DATA:  56 y/o F; Inversion twisting injury with tenderness to palpation of the distal fifth metatarsal bone. EXAM: LEFT ANKLE COMPLETE - 3+ VIEW COMPARISON:  Concurrent foot radiographs. FINDINGS: There is no evidence of fracture, dislocation, or joint effusion. There is no evidence of arthropathy or other focal bone abnormality. Soft tissues are unremarkable. IMPRESSION: Negative. Electronically Signed: By: Mitzi Hansen M.D. On: 09/19/2016 18:44    Assessment and Plan: It is my opinion this injury occurred while Alicia Garcia was at work.  1.  Sprain of left ankle, unspecified ligament, initial encounter 2. Acute left ankle pain - DG Ankle Complete Left; Future - DG Foot Complete Left; Future - Continue Tylenol and/or ibuprofen for pain. Lace-up ankle brace applied to left ankle. Advised pt to wear this brace x 3-4 weeks. Elevate foot after work and use ace wrap to decrease  swelling. She may apply ice as needed for the next few days. She is to initiate ROM exercises daily in 3 days x 1 week. As tolerated, she is to start resistance band exercises daily x 1-2 weeks. She has a resistance band at home.Exercises provided for pt.  F/u in 1 week.   Marco CollieWhitney Tameron Lama, PA-C [facility] September 19, 2016, 1409

## 2016-09-19 NOTE — Patient Instructions (Addendum)
Please keep your ankle wrapped in ankle brace for the next 3-4 weeks. Use ace wrap to help reduce swelling.  Elevate your ankle at night for the next week and apply ice for the next 2-3 days. Ibuprofen and/or Tylenol for pain.  Please start the below ankle exercises next week.   Come back and see me in 1 week.   Thank you for coming in today. I hope you feel we met your needs.  Feel free to call [facility] if you have any questions or further requests.  Please consider signing up for MyChart if you do not already have it, as this is a great way to communicate with me.  Best,  Whitney McVey, PA-C  Patient is to perform exercises below at 5 sets with 10 repetitions. Stretches are to be performed for 5 sets, 10 seconds each. Recommended she perform this rehab twice daily within pain tolerance for 2 weeks.  Ankle Sprain, Phase I Rehab Ask your health care provider which exercises are safe for you. Do exercises exactly as told by your health care provider and adjust them as directed. It is normal to feel mild stretching, pulling, tightness, or discomfort as you do these exercises, but you should stop right away if you feel sudden pain or your pain gets worse.Do not begin these exercises until told by your health care provider. Stretching and range of motion exercises These exercises warm up your muscles and joints and improve the movement and flexibility of your lower leg and ankle. These exercises also help to relieve pain and stiffness. Exercise A: Gastroc and soleus stretch   1. Sit on the floor with your left / right leg extended. 2. Loop a belt or towel around the ball of your left / right foot. The ball of your foot is on the walking surface, right under your toes. 3. Keep your left / right ankle and foot relaxed and keep your knee straight while you use the belt or towel to pull your foot toward you. You should feel a gentle stretch behind your calf or knee. 4. Hold this position for __________  seconds, then release to the starting position. Repeat the exercise with your knee bent. You can put a pillow or a rolled bath towel under your knee to support it. You should feel a stretch deep in your calf or at your Achilles tendon. Repeat each stretch __________ times. Complete these stretches __________ times a day. Exercise B: Ankle alphabet   1. Sit with your left / right leg supported at the lower leg.  Do not rest your foot on anything.  Make sure your foot has room to move freely. 2. Think of your left / right foot as a paintbrush, and move your foot to trace each letter of the alphabet in the air. Keep your hip and knee still while you trace. Make the letters as large as you can without feeling discomfort. 3. Trace every letter from A to Z. Repeat __________ times. Complete this exercise __________ times a day. Strengthening exercises These exercises build strength and endurance in your ankle and lower leg. Endurance is the ability to use your muscles for a long time, even after they get tired. Exercise C: Dorsiflexors   1. Secure a rubber exercise band or tube to an object, such as a table leg, that will stay still when the band is pulled. Secure the other end around your left / right foot. 2. Sit on the floor facing the object, with  your left / right leg extended. The band or tube should be slightly tense when your foot is relaxed. 3. Slowly bring your foot toward you, pulling the band tighter. 4. Hold this position for __________ seconds. 5. Slowly return your foot to the starting position. Repeat __________ times. Complete this exercise __________ times a day. Exercise D: Plantar flexors   1. Sit on the floor with your left / right leg extended. 2. Loop a rubber exercise tube or band around the ball of your left / right foot. The ball of your foot is on the walking surface, right under your toes.  Hold the ends of the band or tube in your hands.  The band or tube should  be slightly tense when your foot is relaxed. 3. Slowly point your foot and toes downward, pushing them away from you. 4. Hold this position for __________ seconds. 5. Slowly return your foot to the starting position. Repeat __________ times. Complete this exercise __________ times a day. Exercise E: Evertors  1. Sit on the floor with your legs straight out in front of you. 2. Loop a rubber exercise band or tube around the ball of your left / right foot. The ball of your foot is on the walking surface, right under your toes.  Hold the ends of the band in your hands, or secure the band to a stable object.  The band or tube should be slightly tense when your foot is relaxed. 3. Slowly push your foot outward, away from your other leg. 4. Hold this position for __________ seconds. 5. Slowly return your foot to the starting position. Repeat __________ times. Complete this exercise __________ times a day. This information is not intended to replace advice given to you by your health care provider. Make sure you discuss any questions you have with your health care provider. Document Released: 01/29/2005 Document Revised: 03/06/2016 Document Reviewed: 05/14/2015 Elsevier Interactive Patient Education  2017 [employer].  IF you received an x-ray today, you will receive an invoice from [facility]. Please contact [facility] at [phone] with questions or concerns regarding your invoice.   IF you received labwork today, you will receive an invoice from [place]. Please contact [facility] at [phone] with questions or concerns regarding your invoice.   Our billing staff will not be able to assist you with questions regarding bills from these companies.  You will be contacted with the lab results as soon as they are available. The fastest way to get your results is to activate your My Chart account. Instructions are located on the last page of this paperwork. If you have not  heard from [place] regarding the results in 2 weeks, please contact this office.

## 2016-10-03 ENCOUNTER — Ambulatory Visit (INDEPENDENT_AMBULATORY_CARE_PROVIDER_SITE_OTHER): Payer: Worker's Compensation | Admitting: Physician Assistant

## 2016-10-03 VITALS — BP 116/71 | HR 62 | Temp 98.6°F | Resp 17 | Ht 66.0 in | Wt 169.0 lb

## 2016-10-03 DIAGNOSIS — S93402D Sprain of unspecified ligament of left ankle, subsequent encounter: Secondary | ICD-10-CM | POA: Diagnosis not present

## 2016-10-03 DIAGNOSIS — Z026 Encounter for examination for insurance purposes: Secondary | ICD-10-CM

## 2016-10-03 NOTE — Patient Instructions (Addendum)
Phase 2 - Continue wearing your brace daily. Elevate as needed for pain and swelling. Continue with your resistance band exercises (see below). Be sure to stretch your achilles tendon. Continue these exercises until you have full range of motion and about 80% of normal ankle strength.   Come back and see me in 2 weeks.   Thank you for coming in today. I hope you feel we met your needs.  Feel free to call [facility] if you have any questions or further requests.  Please consider signing up for MyChart if you do not already have it, as this is a great way to communicate with me.  Best,  Whitney McVey, PA-C   Ankle Sprain, Phase 2 Rehab Ask your health care provider which exercises are safe for you. Do exercises exactly as told by your health care provider and adjust them as directed. It is normal to feel mild stretching, pulling, tightness, or discomfort as you do these exercises, but you should stop right away if you feel sudden pain or your pain gets worse.Do not begin these exercises until told by your health care provider. Stretching and range of motion exercises These exercises warm up your muscles and joints and improve the movement and flexibility of your lower leg and ankle. These exercises also help to relieve pain and stiffness. Exercise A: Gastroc and soleus stretch   1. Sit on the floor with your left / right leg extended. 2. Loop a belt or towel around the ball of your left / right foot. The ball of your foot is on the walking surface, right under your toes. 3. Keep your left / right ankle and foot relaxed and keep your knee straight while you use the belt or towel to pull your foot toward you. You should feel a gentle stretch behind your calf or knee. 4. Hold this position for __________ seconds, then release to the starting position. Repeat the exercise with your knee bent. You can put a pillow or a rolled bath towel under your knee to support it. You should feel a stretch deep in  your calf or at your Achilles tendon. Repeat each stretch __________ times. Complete these stretches __________ times a day. Exercise B: Ankle alphabet   1. Sit with your left / right leg supported at the lower leg.  Do not rest your foot on anything.  Make sure your foot has room to move freely. 2. Think of your left / right foot as a paintbrush, and move your foot to trace each letter of the alphabet in the air. Keep your hip and knee still while you trace. Make the letters as large as you can without feeling discomfort. 3. Trace every letter from A to Z. Repeat __________ times. Complete this exercise __________ times a day. Strengthening exercises These exercises build strength and endurance in your ankle and lower leg. Endurance is the ability to use your muscles for a long time, even after they get tired. Exercise C: Dorsiflexors   1. Secure a rubber exercise band or tube to an object, such as a table leg, that will stay still when the band is pulled. Secure the other end around your left / right foot. 2. Sit on the floor facing the object, with your left / right leg extended. The band or tube should be slightly tense when your foot is relaxed. 3. Slowly bring your foot toward you, pulling the band tighter. 4. Hold this position for __________ seconds. 5. Slowly return your  foot to the starting position. Repeat __________ times. Complete this exercise __________ times a day. Exercise D: Plantar flexors   1. Sit on the floor with your left / right leg extended. 2. Loop a rubber exercise tube or band around the ball of your left / right foot. The ball of your foot is on the walking surface, right under your toes.  Hold the ends of the band or tube in your hands.  The band or tube should be slightly tense when your foot is relaxed. 3. Slowly point your foot and toes downward, pushing them away from you. 4. Hold this position for __________ seconds. 5. Slowly return your foot to the  starting position. Repeat __________ times. Complete this exercise __________ times a day. Exercise E: Evertors  1. Sit on the floor with your legs straight out in front of you. 2. Loop a rubber exercise band or tube around the ball of your left / right foot. The ball of your foot is on the walking surface, right under your toes.  Hold the ends of the band in your hands, or secure the band to a stable object.  The band or tube should be slightly tense when your foot is relaxed. 3. Slowly push your foot outward, away from your other leg. 4. Hold this position for __________ seconds. 5. Slowly return your foot to the starting position. Repeat __________ times. Complete this exercise __________ times a day. This information is not intended to replace advice given to you by your health care provider. Make sure you discuss any questions you have with your health care provider. Document Released: 01/29/2005 Document Revised: 03/06/2016 Document Reviewed: 05/14/2015 Elsevier Interactive Patient Education  2017 [employer].  IF you received an x-ray today, you will receive an invoice from [facility]. Please contact [facility] at [phone] with questions or concerns regarding your invoice.   IF you received labwork today, you will receive an invoice from [place]. Please contact [facility] at [phone] with questions or concerns regarding your invoice.   Our billing staff will not be able to assist you with questions regarding bills from these companies.  You will be contacted with the lab results as soon as they are available. The fastest way to get your results is to activate your My Chart account. Instructions are located on the last page of this paperwork. If you have not heard from [place] regarding the results in 2 weeks, please contact this office.

## 2016-10-03 NOTE — Progress Notes (Signed)
Alicia Garcia 1961-06-19 56 y.o.   Chief Complaint  Patient presents with  . Follow-up    Left ankle sprain     Date of Injury: 09/15/2016  History of Present Illness:  Pt is a 56 year old female who presents for evaluation of work-related complaint. She was here 3/09 for sprained left ankle after she tripped on the last stair while walking down the steps at work. At that OV her pain was 5/10, it was 8/10 when it happened. She was put in a lace-up ankle brace, asked to perform passive ROM exercises, advised to elevate and ice her ankle daily and was asked to RTC in 1 week.   She is doing passive ROM exercises, used ice, elevation, ankle brace daily and ace wrap occasionally, which helped with swelling. She started resistance band exercises a few days ago - "I feel better after I do them".  She is not wearing high-heeled shoes. Today her pain level is 4/10. Denies swelling, redness, increased warmth, tenderness, reduced ROM.   Review of Systems  Musculoskeletal: Positive for joint pain (left ankle).    No Known Allergies   Current medications reviewed and updated. Past medical history, family history, social history have been reviewed and updated.   Physical Exam  Constitutional: She is oriented to person, place, and time and well-developed, well-nourished, and in no distress. No distress.  Cardiovascular: Normal rate, regular rhythm and normal heart sounds.   Musculoskeletal:       Left ankle: She exhibits normal range of motion. No tenderness.  Tenderness with passive ROM with adduction.  Neurological: She is alert and oriented to person, place, and time. GCS score is 15.  Skin: Skin is warm and dry.  Psychiatric: Mood, memory, affect and judgment normal.  Vitals reviewed.    Assessment and Plan: 1. Sprain of left ankle, unspecified ligament, subsequent encounter 2. Encounter related to worker's compensation claim - Pt is healing appropriately. Phase 2 rehab for ankle  sprain. Continue wearing brace daily. Elevate as needed for pain and swelling. Continue resistance band exercises. Continue these exercises until there is full ROM and about 80% of normal ankle strength. Then proceed to phase 3 rehab. RTC in 2 weeks for f/u.   Marco CollieWhitney Latori Beggs, PA-C  Primary Care at [facility] 10/03/2016 4:31 PM

## 2016-10-06 ENCOUNTER — Ambulatory Visit: Payer: Self-pay | Admitting: Physician Assistant

## 2016-10-07 ENCOUNTER — Telehealth: Payer: Self-pay | Admitting: Radiology

## 2016-10-07 NOTE — Telephone Encounter (Signed)
I have gotten a voicemail from [place] at Dr Hyman HopesWebb office to send lab reports on patient but I do not have any recent labs to send

## 2016-10-20 ENCOUNTER — Ambulatory Visit (INDEPENDENT_AMBULATORY_CARE_PROVIDER_SITE_OTHER): Payer: Worker's Compensation | Admitting: Physician Assistant

## 2016-10-20 VITALS — BP 104/69 | HR 63 | Resp 16 | Wt 165.0 lb

## 2016-10-20 DIAGNOSIS — M25572 Pain in left ankle and joints of left foot: Secondary | ICD-10-CM

## 2016-10-20 DIAGNOSIS — Z026 Encounter for examination for insurance purposes: Secondary | ICD-10-CM

## 2016-10-20 DIAGNOSIS — S93402D Sprain of unspecified ligament of left ankle, subsequent encounter: Secondary | ICD-10-CM | POA: Diagnosis not present

## 2016-10-20 NOTE — Progress Notes (Signed)
Alicia Garcia [birth year] 56 y.o.   Chief Complaint  Patient presents with  . Follow-up    ankle     Date of Injury: 09/15/2016   History of Present Illness:  Presents for evaluation of work-related complaint.   She was here 3/09 for sprained left ankle after she tripped on the last stair while walking down the steps at work. At that OV her pain was 5/10, it was 8/10 when it happened. She was put in a lace-up ankle brace, asked to perform passive ROM exercises, advised to elevate and ice her ankle daily.   This is her 2nd f/u OV for this problem. Last OV pain was 4/10.  Today her pain is 3/10 pain. She is wearing brace most days of the week. "just a little but swollen" at the end of the day. She has been performing home ankle exercises a few times a week.  She feels like she wants to wear brace when she is doing increased activities - such as moving heavy things around.  Can feel increase in pain it if she crosses her ankles for a while.   Denies swelling, redness, increased warmth, tenderness, reduced ROM.   Review of Systems  Musculoskeletal: Positive for myalgias (left ankle).      No Known Allergies   Current medications reviewed and updated. Past medical history, family history, social history have been reviewed and updated.   Physical Exam  Constitutional: She is oriented to person, place, and time and well-developed, well-nourished, and in no distress. No distress.  Cardiovascular: Normal rate, regular rhythm and normal heart sounds.   Musculoskeletal:       Left ankle: She exhibits normal range of motion, no swelling and normal pulse. No tenderness.  Neurological: She is alert and oriented to person, place, and time. GCS score is 15.  Skin: Skin is warm and dry.  Psychiatric: Mood, memory, affect and judgment normal.  Vitals reviewed.   Assessment and Plan: 1. Sprain of left ankle, unspecified ligament, subsequent encounter 2. Left ankle pain, unspecified  chronicity 3. Encounter related to worker's compensation claim - Pt is on her 4th week s/p left ankle sprain. She is healing as expected. Encouraged pt to continue wearing brace for another week - then wear for stability during increased physical activity. Slowly start to initiate phase 3 rehab exercises with functional conditioning. Instructions printed out for pt.  - RTC if not improving. Consider referral to physical therapy at that time  Alicia Collie, PA-C  Primary Care at [facility] 10/20/2016 6:36 PM

## 2016-10-20 NOTE — Patient Instructions (Addendum)
Phase 3 is 4-6 weeks after injury. This is functional conditioning: proprioception, agility and endurance training.  Stand on sprained ankle with the opposite foot elevated and eyes closed. Balance boards are very useful during this phase.  Running in progressively smaller figures-of-8 is excellent. During this time, you should wean yourself from your snkle brace.    Exercise I: Towel curls   1. Sit in a chair on a non-carpeted surface, and put your feet on the floor. 2. Place a towel in front of your feet. If told by your health care provider, add __________ to the end of the towel. 3. Keeping your heel on the floor, put your left / right footon the towel. 4. Pull the towel toward you by grabbing the towel with your toes and curling them under. Keep your heel on the floor. Repeat __________ times. Complete this exercise __________ times a day. This information is not intended to replace advice given to you by your health care provider. Make sure you discuss any questions you have with your health care provider. Document Released: 10/22/2015 Document Revised: 03/06/2016 Document Reviewed: 07/14/2015 Elsevier Interactive Patient Education  2017 [employer].        Please come back if you feel like you are not improving. We can refer you to physical therapy at that time.    Thank you for coming in today. I hope you feel we met your needs.  Feel free to call [facility] if you have any questions or further requests.  Please consider signing up for MyChart if you do not already have it, as this is a great way to communicate with me.  Best,  [employer], PA-C

## 2016-11-27 DIAGNOSIS — R760 Raised antibody titer: Secondary | ICD-10-CM | POA: Insufficient documentation

## 2016-11-27 NOTE — Progress Notes (Signed)
Office Visit Note  Patient: Alicia Garcia             Date of Birth: [birth year]           MRN: [id number]             PCP: Shirlean MylarWebb, Carol, MD Referring: Andi DevonShelton, Kimberly, MD Visit Date: 12/09/2016 Occupation: @GUAROCC @    Subjective:  Follow-up   History of Present Illness: Alicia Garcia is a 56 y.o. female with history of systemic lupus erythematosus. She continues to do well. She denies any discomfort or joint swelling. She denies any new symptoms.  Activities of Daily Living:  Patient reports morning stiffness for 0 minute.   Patient Denies nocturnal pain.  Difficulty dressing/grooming: Denies Difficulty climbing stairs: Denies Difficulty getting out of chair: Reports Difficulty using hands for taps, buttons, cutlery, and/or writing: Denies   Review of Systems  Constitutional: Negative for fatigue, night sweats, weight gain, weight loss and weakness.  HENT: Negative for mouth sores, trouble swallowing, trouble swallowing, mouth dryness and nose dryness.   Eyes: Negative for pain, redness, visual disturbance and dryness.  Respiratory: Negative for cough, shortness of breath and difficulty breathing.   Cardiovascular: Negative for chest pain, palpitations, hypertension, irregular heartbeat and swelling in legs/feet.  Gastrointestinal: Negative for blood in stool, constipation and diarrhea.  Endocrine: Negative for increased urination.  Genitourinary: Negative for vaginal dryness.  Musculoskeletal: Negative for arthralgias, joint pain, joint swelling, myalgias, muscle weakness, morning stiffness, muscle tenderness and myalgias.  Skin: Negative for color change, rash, hair loss, skin tightness, ulcers and sensitivity to sunlight.  Allergic/Immunologic: Negative for susceptible to infections.  Neurological: Negative for dizziness, memory loss and night sweats.  Hematological: Negative for swollen glands.  Psychiatric/Behavioral: Negative for depressed mood and sleep  disturbance. The patient is not nervous/anxious.     PMFS History:  Patient Active Problem List   Diagnosis Date Noted  . 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 Thalassemia 08/13/2016  . Vitamin D deficiency 06/24/2016  . Systemic lupus erythematosus (HCC) 06/24/2016  . Primary osteoarthritis of both knees 06/24/2016    Past Medical History:  Diagnosis Date  . Anemia   . Heart murmur    as a child  . Lupus     Family History  Problem Relation Age of Onset  . Heart disease Brother    Past Surgical History:  Procedure Laterality Date  . COLONOSCOPY     2012, normal   . TONSILLECTOMY    . TUBAL LIGATION     Social History   Social History Narrative  . No narrative on file     Objective: Vital Signs: BP 122/62   Pulse 61   Resp 16   Ht 5\' 6"  (1.676 m)   Wt 162 lb (73.5 kg)   BMI 26.15 kg/m    Physical Exam  Constitutional: She is oriented to person, place, and time. She appears well-developed and well-nourished.  HENT:  Head: Normocephalic and atraumatic.  Eyes: Conjunctivae and EOM are normal.  Neck: Normal range of motion.  Cardiovascular: Normal rate, regular rhythm, normal heart sounds and intact distal pulses.   Pulmonary/Chest: Effort normal and breath sounds normal.  Abdominal: Soft. Bowel sounds are normal.  Lymphadenopathy:    She has no cervical adenopathy.  Neurological: She is alert and oriented to person, place, and time.  Skin: Skin is warm and dry. Capillary refill takes less than 2 seconds.  Psychiatric: She has a normal mood and affect. Her behavior  is normal.  Nursing note and vitals reviewed.    Musculoskeletal Exam: C-spine and thoracic lumbar spine good range of motion. Shoulder joints although joints wrist joint MCPs PIPs DIPs with good range of motion. Hip joints knee joints ankles MTPs PIPs DIPs are good range of motion with no synovitis.  CDAI Exam: No CDAI exam completed.    Investigation: No additional  findings.     Imaging: No results found.  Speciality Comments: No specialty comments available.    Procedures:  No procedures performed Allergies: Patient has no known allergies.   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 She's been in remission without any flare and has not been taking any medications. I will check following labs today.  Anticardiolipin antibody positive she's been taking baby aspirin and advised to take it again.  Primary osteoarthritis of both knees: Minimal discomfort  Thalassemia, unspecified type  Vitamin D deficiency : She is history of vitamin D deficiency I'll check her vitamin D levels again today.   Orders: Orders Placed This Encounter  Procedures  . CBC with Differential/Platelet  . COMPLETE METABOLIC PANEL WITH GFR  . Urinalysis, Routine w reflex microscopic  . Sedimentation rate  . ANA  . Anti-DNA antibody, double-stranded  . C3 and C4  . VITAMIN D 25 Hydroxy (Vit-D Deficiency, Fractures)  . Cardiolipin antibodies, IgG, IgM, IgA   No orders of the defined types were placed in this encounter.   Face-to-face time spent with patient was 30 minutes. 50% of time was spent in counseling and coordination of care.  Follow-Up Instructions: Return in about 1 year (around 12/09/2017).   Pollyann Savoy, MD  Note - This record has been created using [employer].  Chart creation errors have been sought, but may not always  have been located. Such creation errors do not reflect on  the standard of medical care.

## 2016-12-09 ENCOUNTER — Encounter: Payer: Self-pay | Admitting: Rheumatology

## 2016-12-09 ENCOUNTER — Ambulatory Visit (INDEPENDENT_AMBULATORY_CARE_PROVIDER_SITE_OTHER): Payer: 59 | Admitting: Rheumatology

## 2016-12-09 VITALS — BP 122/62 | HR 61 | Resp 16 | Ht 66.0 in | Wt 162.0 lb

## 2016-12-09 DIAGNOSIS — D569 Thalassemia, unspecified: Secondary | ICD-10-CM | POA: Diagnosis not present

## 2016-12-09 DIAGNOSIS — E559 Vitamin D deficiency, unspecified: Secondary | ICD-10-CM

## 2016-12-09 DIAGNOSIS — M170 Bilateral primary osteoarthritis of knee: Secondary | ICD-10-CM

## 2016-12-09 DIAGNOSIS — R768 Other specified abnormal immunological findings in serum: Secondary | ICD-10-CM

## 2016-12-09 DIAGNOSIS — M3213 Lung involvement in systemic lupus erythematosus: Secondary | ICD-10-CM

## 2016-12-09 DIAGNOSIS — R760 Raised antibody titer: Secondary | ICD-10-CM

## 2016-12-09 LAB — COMPLETE METABOLIC PANEL WITH GFR
ALT: 9 U/L (ref 6–29)
AST: 16 U/L (ref 10–35)
Albumin: 4.1 g/dL (ref 3.6–5.1)
Alkaline Phosphatase: 81 U/L (ref 33–130)
BILIRUBIN TOTAL: 0.5 mg/dL (ref 0.2–1.2)
BUN: 14 mg/dL (ref 7–25)
CHLORIDE: 102 mmol/L (ref 98–110)
CO2: 23 mmol/L (ref 20–31)
CREATININE: 0.6 mg/dL (ref 0.50–1.05)
Calcium: 9.4 mg/dL (ref 8.6–10.4)
GFR, Est Non African American: >89 mL/min (ref >=60)
Glucose, Bld: 70 mg/dL (ref 65–99)
Potassium: 4.3 mmol/L (ref 3.5–5.3)
SODIUM: 138 mmol/L (ref 135–146)
TOTAL PROTEIN: 7.5 g/dL (ref 6.1–8.1)

## 2016-12-09 LAB — CBC WITH DIFFERENTIAL/PLATELET
BASOS PCT: 0 %
Basophils Absolute: 0 cells/uL (ref 0–200)
EOS ABS: 108 {cells}/uL (ref 15–500)
Eosinophils Relative: 2 %
HCT: 34.3 % — ABNORMAL LOW (ref 35.0–45.0)
Hemoglobin: 10.3 g/dL — ABNORMAL LOW (ref 11.7–15.5)
LYMPHS PCT: 33 %
Lymphs Abs: 1782 cells/uL (ref 850–3900)
MCH: 22.9 pg — ABNORMAL LOW (ref 27.0–33.0)
MCHC: 30 g/dL — ABNORMAL LOW (ref 32.0–36.0)
MCV: 76.2 fL — AB (ref 80.0–100.0)
MONOS PCT: 7 %
MPV: 10.2 fL (ref 7.5–12.5)
Monocytes Absolute: 378 cells/uL (ref 200–950)
NEUTROS ABS: 3132 {cells}/uL (ref 1500–7800)
Neutrophils Relative %: 58 %
PLATELETS: 251 10*3/uL (ref 140–400)
RBC: 4.5 MIL/uL (ref 3.80–5.10)
RDW: 13.7 % (ref 11.0–15.0)
WBC: 5.4 10*3/uL (ref 3.8–10.8)

## 2016-12-10 LAB — URINALYSIS, ROUTINE W REFLEX MICROSCOPIC
Bilirubin Urine: NEGATIVE
GLUCOSE, UA: NEGATIVE
KETONES UR: NEGATIVE
Leukocytes, UA: NEGATIVE
NITRITE: NEGATIVE
PH: 5.5 (ref 5.0–8.0)
Protein, ur: NEGATIVE
SPECIFIC GRAVITY, URINE: 1.022 (ref 1.001–1.035)

## 2016-12-10 LAB — ANTI-DNA ANTIBODY, DOUBLE-STRANDED: ds DNA Ab: 2 IU/mL

## 2016-12-10 LAB — URINALYSIS, MICROSCOPIC ONLY
BACTERIA UA: NONE SEEN [HPF]
CASTS: NONE SEEN [LPF]
Crystals: NONE SEEN [HPF]
YEAST: NONE SEEN [HPF]

## 2016-12-10 LAB — CARDIOLIPIN ANTIBODIES, IGG, IGM, IGA
Anticardiolipin IgA: <11 [APL'U]
Anticardiolipin IgM: 13 [MPL'U] — ABNORMAL HIGH

## 2016-12-10 LAB — VITAMIN D 25 HYDROXY (VIT D DEFICIENCY, FRACTURES): Vit D, 25-Hydroxy: 24 ng/mL — ABNORMAL LOW (ref 30–100)

## 2016-12-10 LAB — C3 AND C4
C3 Complement: 111 mg/dL (ref 83–193)
C4 Complement: 22 mg/dL (ref 15–57)

## 2016-12-10 LAB — ANTI-NUCLEAR AB-TITER (ANA TITER): ANA Titer 1: 1:80 {titer} — ABNORMAL HIGH

## 2016-12-10 LAB — SEDIMENTATION RATE: Sed Rate: 34 mm/hr — ABNORMAL HIGH (ref 0–30)

## 2016-12-10 LAB — ANA: ANA: POSITIVE — AB

## 2016-12-10 NOTE — Progress Notes (Signed)
Autoimmune labs are stable. ESR elevated but stable. Anemia persist. She has vitamin D deficiency. Please call and vitamin D 50,000 units once a week for 3 months. Check vitamin D level in 3 months. Patient was asymptomatic

## 2016-12-16 ENCOUNTER — Telehealth: Payer: Self-pay | Admitting: Rheumatology

## 2016-12-16 NOTE — Telephone Encounter (Signed)
Patient would like you to call her.  CB#[phone].  Thank you.

## 2016-12-16 NOTE — Telephone Encounter (Signed)
Attempted to contact the patient and left message for patient to call the office.  

## 2016-12-24 ENCOUNTER — Telehealth: Payer: Self-pay | Admitting: Radiology

## 2016-12-24 MED ORDER — VITAMIN D3 1.25 MG (50000 UT) PO CAPS: 50000.0000 [IU] | ORAL_CAPSULE | ORAL | 0 refills | Status: AC

## 2016-12-24 NOTE — Telephone Encounter (Signed)
Unable to reach, Alicia Garcia has also been unable to reach her by phone. Have sent in Vitamin d

## 2016-12-24 NOTE — Telephone Encounter (Signed)
Patient returned your call.  CB#[phone].  Thank you.

## 2016-12-24 NOTE — Telephone Encounter (Signed)
-----  Message from Shaili Deveshwar, MD sent at 12/10/2016  5:06 PM EDT ----- Autoimmune labs are stable. ESR elevated but stable. Anemia persist. She has vitamin D deficiency. Please call and vitamin D 50,000 units once a week for 3 months. Check vitamin D level in 3 months. Patient was asymptomatic 

## 2016-12-25 NOTE — Telephone Encounter (Signed)
See other phone note

## 2016-12-25 NOTE — Telephone Encounter (Signed)
Attempted to contact the patient and left message for patient to call the office.  

## 2017-01-01 NOTE — Telephone Encounter (Signed)
Attempted to contact patient and left message for patient and left message for patient to call the office.

## 2017-01-02 NOTE — Telephone Encounter (Signed)
Unable to reach , multiple attempts made.

## 2017-02-02 ENCOUNTER — Ambulatory Visit (HOSPITAL_COMMUNITY)
Admission: EM | Admit: 2017-02-02 | Discharge: 2017-02-02 | Disposition: A | Discharge status: Home / Self Care | Payer: 59 | Attending: Family Medicine | Admitting: Family Medicine

## 2017-02-02 ENCOUNTER — Encounter (HOSPITAL_COMMUNITY): Payer: Self-pay | Admitting: *Deleted

## 2017-02-02 DIAGNOSIS — R0789 Other chest pain: Secondary | ICD-10-CM

## 2017-02-02 DIAGNOSIS — R001 Bradycardia, unspecified: Secondary | ICD-10-CM

## 2017-02-02 MED ORDER — NAPROXEN 500 MG PO TABS: 500.0000 mg | ORAL_TABLET | Freq: Two times a day (BID) | ORAL | 0 refills | Status: DC

## 2017-02-02 NOTE — ED Provider Notes (Signed)
CSN: [id number]     Arrival date & time 02/02/17  1008 History   None    Chief Complaint  Patient presents with  . Chest Pain   (Consider location/radiation/quality/duration/timing/severity/associated sxs/prior Treatment) HPI  Past Medical History:  Diagnosis Date  . Anemia   . Heart murmur    as a child  . Lupus    Past Surgical History:  Procedure Laterality Date  . COLONOSCOPY     2012, normal   . TONSILLECTOMY    . TUBAL LIGATION     Family History  Problem Relation Age of Onset  . Heart disease Brother    Social History  Substance Use Topics  . Smoking status: Never Smoker  . Smokeless tobacco: Never Used  . Alcohol use No   OB History    No data available     Review of Systems  Allergies  Patient has no known allergies.  Home Medications   Prior to Admission medications   Medication Sig Start Date End Date Taking? Authorizing Provider  aspirin EC 81 MG tablet Take 81 mg by mouth daily.    [provider]  Cholecalciferol (VITAMIN D3) 50000 units CAPS Take 50,000 Units by mouth once a week. 12/24/16 03/24/17  Pollyann Savoyeveshwar, Shaili, MD  naproxen (NAPROSYN) 500 MG tablet Take 1 tablet (500 mg total) by mouth 2 (two) times daily with a meal. 02/02/17   Makiah Foye, Anselm PancoastWilliam J, FNP   Meds Ordered and Administered this Visit  Medications - No data to display  BP 132/74 (BP Location: Left Arm)   Pulse (!) 48   Temp 98.6 F (37 C) (Oral)   Resp 16   SpO2 100%  No data found.   Physical Exam  Constitutional: She is oriented to person, place, and time. She appears well-developed and well-nourished.  HENT:  Head: Normocephalic and atraumatic.  Eyes: Pupils are equal, round, and reactive to light. Conjunctivae and EOM are normal.  Neck: Normal range of motion. Neck supple.  Cardiovascular: Regular rhythm.   HR is bradycardia  Pulmonary/Chest: Effort normal and breath sounds normal.  Abdominal: Soft. Bowel sounds are normal.  Musculoskeletal: Normal range  of motion. She exhibits tenderness.  TTP left anterior chest wall.  Neurological: She is alert and oriented to person, place, and time.  Nursing note and vitals reviewed.   Urgent Care Course     Procedures (including critical care time)  Labs Review Labs Reviewed - No data to display  Imaging Review No results found.   Visual Acuity Review  Right Eye Distance:   Left Eye Distance:   Bilateral Distance:    Right Eye Near:   Left Eye Near:    Bilateral Near:         MDM   1. Chest wall pain   2. Bradycardia    Patient is asymptomatic with bradycardia and explained that this should be followed by her PCP.  Naprosyn 500mg  one po bid prn #20  Explained CP is due to chest wall discomfort and is MS and will go away with the naprosyn.      Deatra CanterOxford, Emanuel Dowson J, FNP 02/02/17 1130

## 2017-02-02 NOTE — ED Triage Notes (Signed)
l   Upper  Chest  Pain         Symptoms       Started       This   Am       Pt        Worked   Out     This   Am          Pt  Reports      Pain  Is   Worse  On  Palpation

## 2017-02-04 ENCOUNTER — Telehealth: Payer: Self-pay

## 2017-02-04 NOTE — Telephone Encounter (Signed)
SENT NOTES TO SCHEDULING 

## 2017-02-16 ENCOUNTER — Encounter: Payer: Self-pay | Admitting: Cardiology

## 2017-03-05 ENCOUNTER — Ambulatory Visit: Payer: 59 | Admitting: Cardiology

## 2017-03-31 ENCOUNTER — Ambulatory Visit: Payer: 59 | Admitting: Cardiology

## 2017-04-09 ENCOUNTER — Ambulatory Visit: Payer: 59 | Admitting: Cardiology

## 2017-04-10 ENCOUNTER — Encounter: Payer: Self-pay | Admitting: Cardiology

## 2017-04-16 IMAGING — CR DG SHOULDER 2+V*R*
3 series · 3 of 3 positions shown · non-contrast
Comparison: None in PACs

CLINICAL DATA: Status post fall onto the right shoulder with
persistent pain.

EXAM:
RIGHT SHOULDER - 2+ VIEW

[AP (1 of 2)]
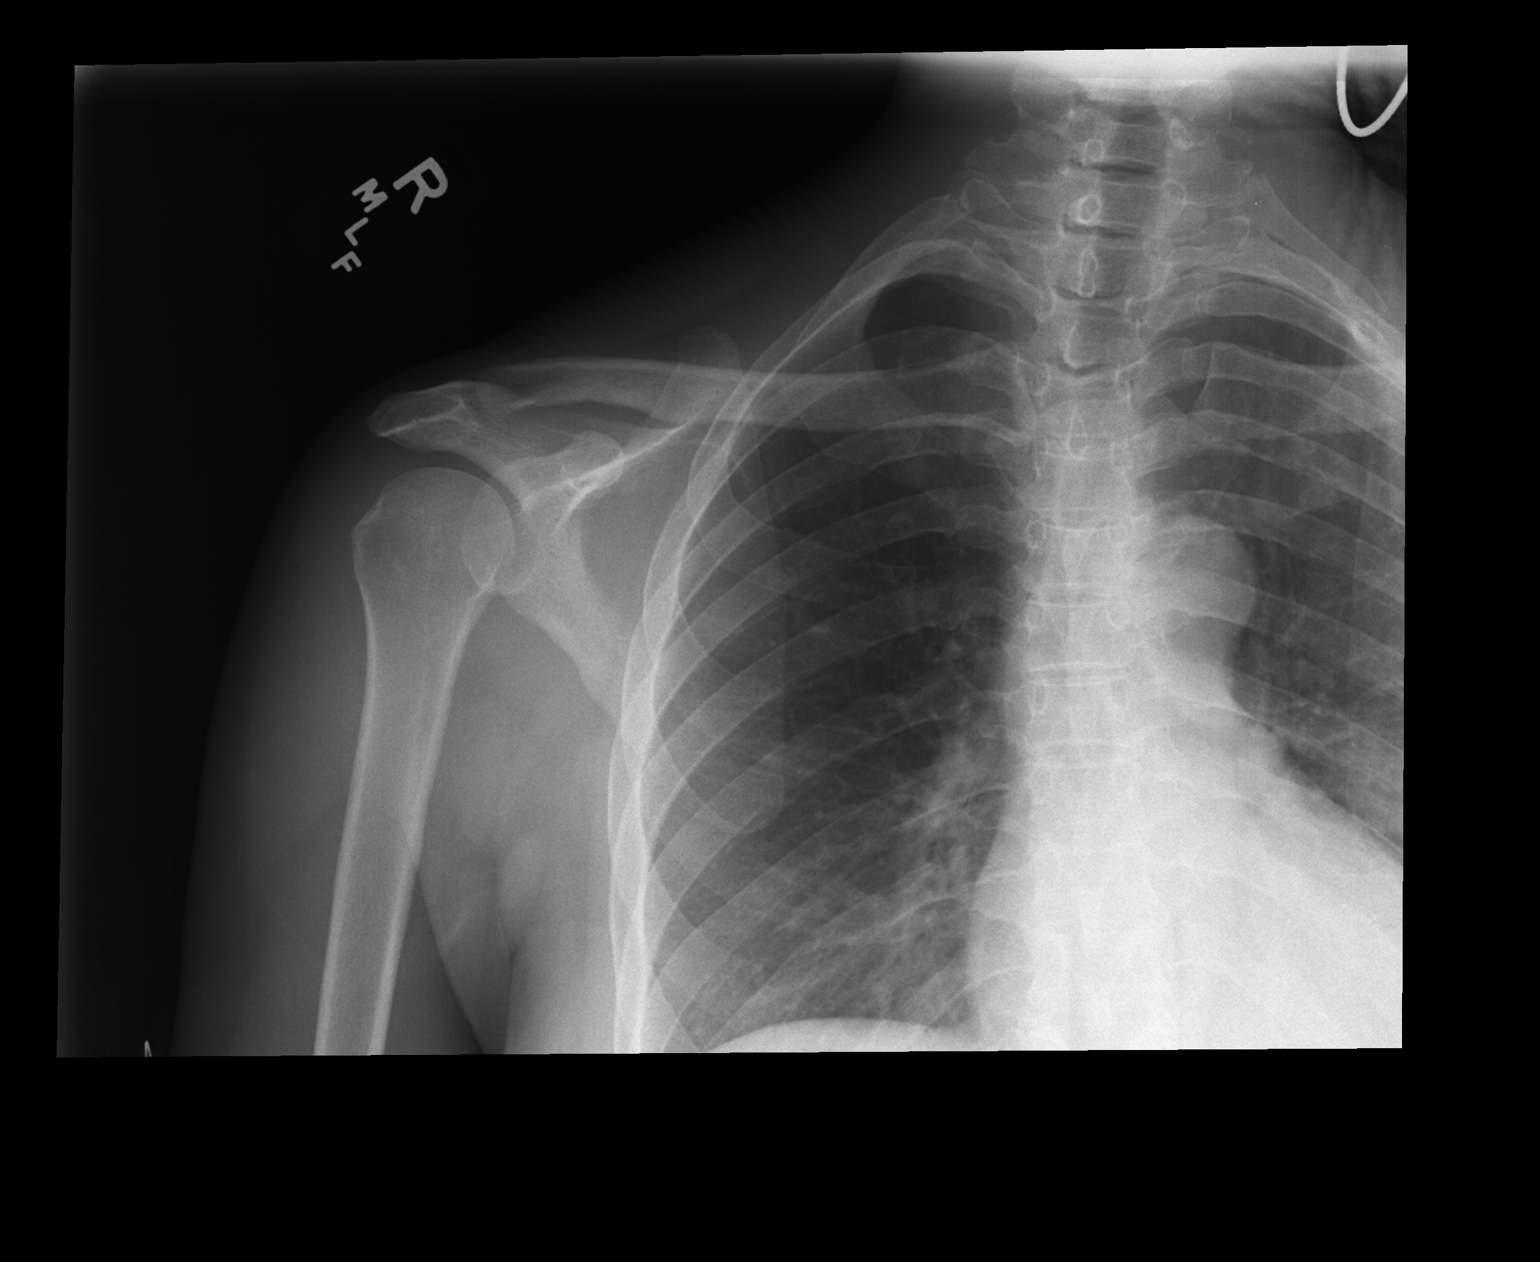

[AP (2 of 2)]
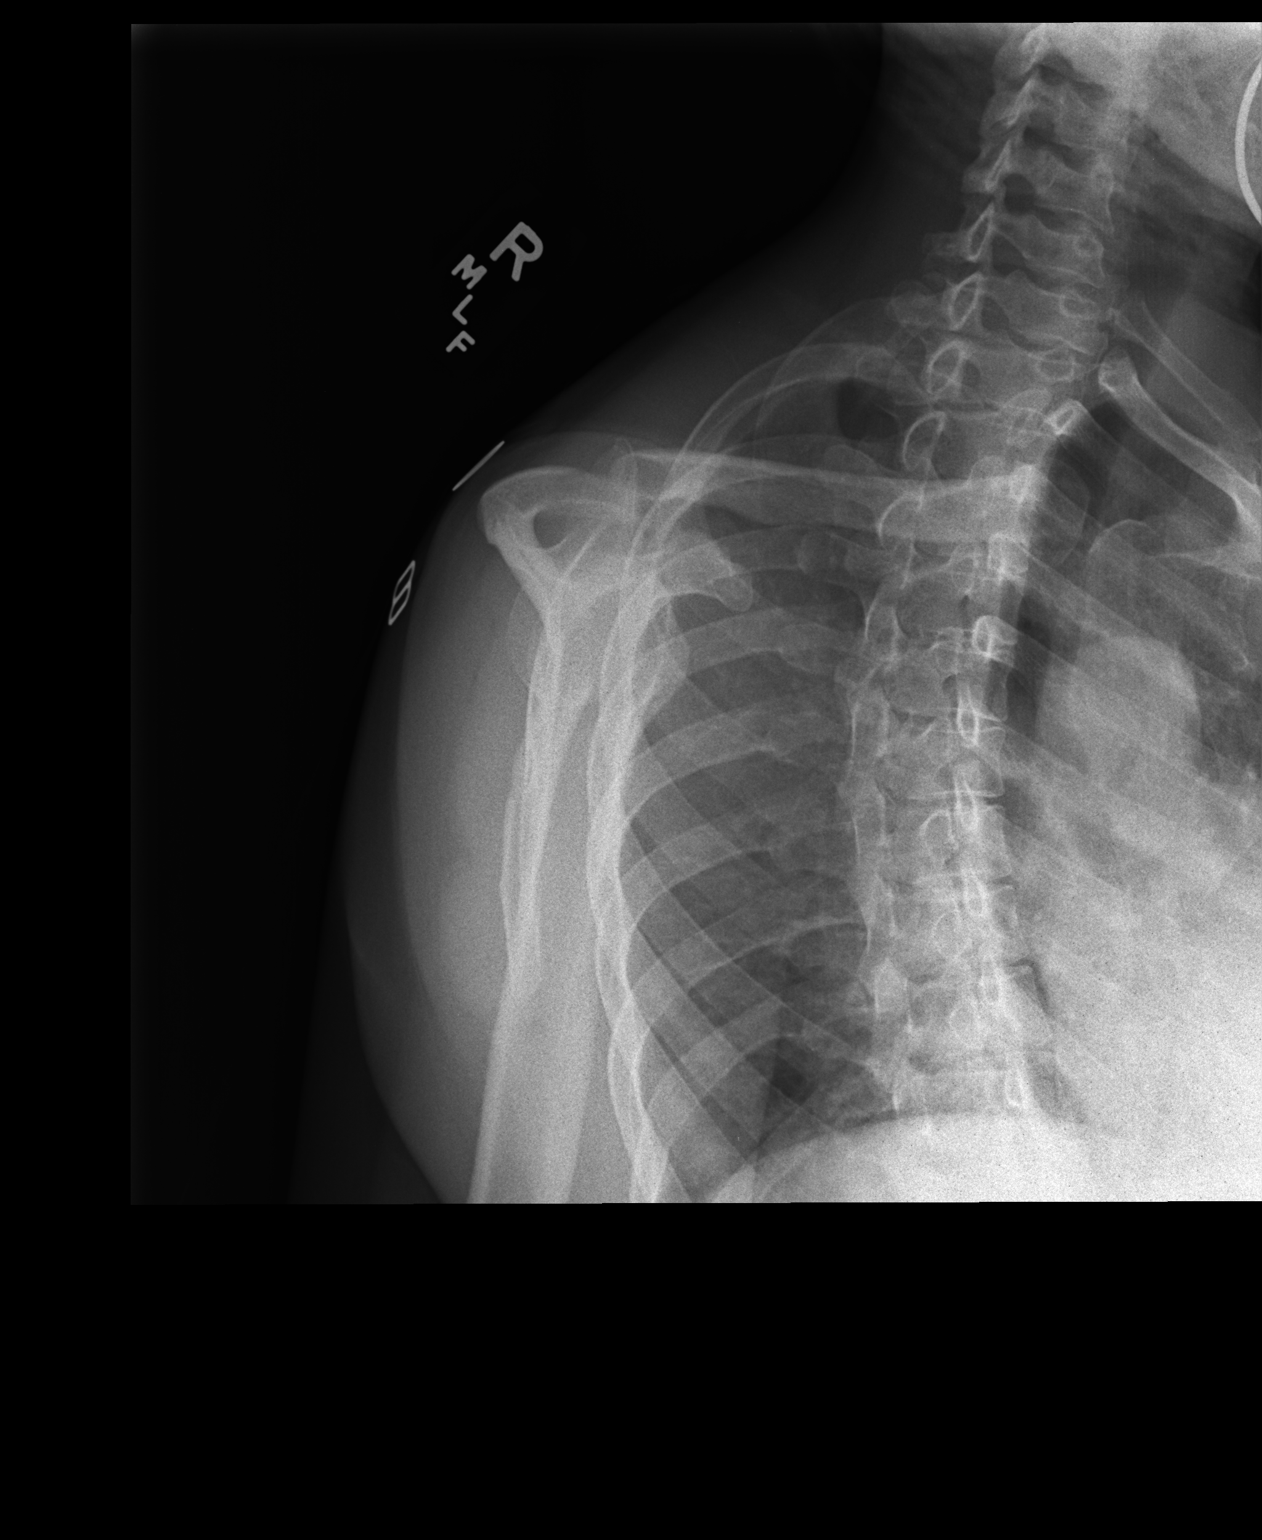

[axial]
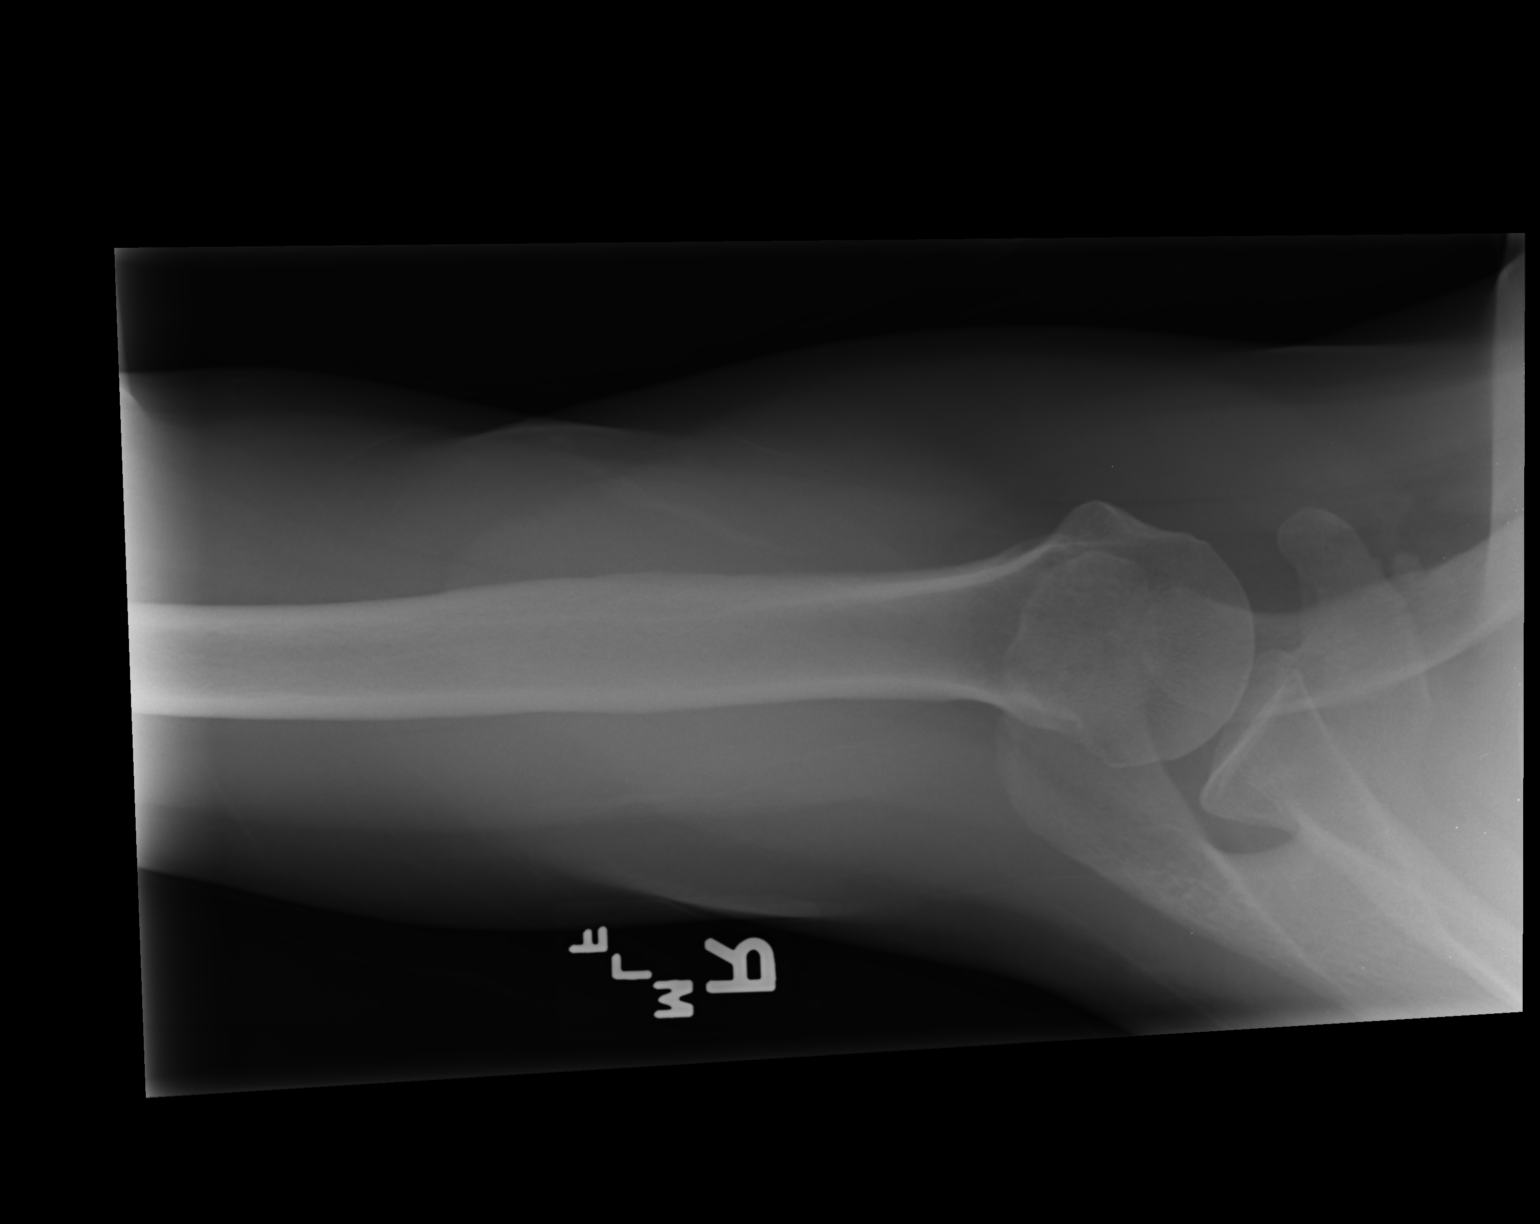

[3 of 3 positions shown; findings below may reference images not displayed]

FINDINGS: The bones of the right shoulder are adequately mineralized. There is
no acute fracture nor dislocation. The glenohumeral and AC joint
spaces are preserved. The observed portions of the right clavicle
and upper right ribs are normal. The scapula exhibits no acute
abnormality.
IMPRESSION: There is no acute bony abnormality of the right shoulder.

## 2017-05-06 ENCOUNTER — Ambulatory Visit: Payer: 59 | Admitting: Cardiology

## 2017-06-01 ENCOUNTER — Ambulatory Visit: Payer: 59 | Admitting: Cardiology

## 2017-06-26 ENCOUNTER — Encounter: Payer: Self-pay | Admitting: Physician Assistant

## 2017-06-26 ENCOUNTER — Ambulatory Visit: Payer: 59 | Admitting: Physician Assistant

## 2017-06-26 ENCOUNTER — Encounter (INDEPENDENT_AMBULATORY_CARE_PROVIDER_SITE_OTHER): Payer: Self-pay

## 2017-06-26 VITALS — BP 109/71 | HR 51 | Ht 66.0 in | Wt 166.2 lb

## 2017-06-26 DIAGNOSIS — Z8249 Family history of ischemic heart disease and other diseases of the circulatory system: Secondary | ICD-10-CM

## 2017-06-26 DIAGNOSIS — D569 Thalassemia, unspecified: Secondary | ICD-10-CM | POA: Diagnosis not present

## 2017-06-26 DIAGNOSIS — M329 Systemic lupus erythematosus, unspecified: Secondary | ICD-10-CM | POA: Diagnosis not present

## 2017-06-26 DIAGNOSIS — R768 Other specified abnormal immunological findings in serum: Secondary | ICD-10-CM

## 2017-06-26 DIAGNOSIS — R760 Raised antibody titer: Secondary | ICD-10-CM

## 2017-06-26 DIAGNOSIS — R001 Bradycardia, unspecified: Secondary | ICD-10-CM | POA: Diagnosis not present

## 2017-06-26 NOTE — Patient Instructions (Signed)
Medication Instructions:  Your physician recommends that you continue on your current medications as directed. Please refer to the Current Medication list given to you today.  If you need a refill on your cardiac medications before your next appointment, please call your pharmacy.   Testing/Procedures: Your physician has requested that you have an echocardiogram. Echocardiography is a painless test that uses sound waves to create images of your heart. It provides your doctor with information about the size and shape of your heart and how well your heart's chambers and valves are working. This procedure takes approximately one hour. There are no restrictions for this procedure.  Your physician has requested that you have en exercise stress test, A nurse or technician  Will place electrodes are taped to your chest to detect your heart's rhythm. A nurse or technician will watch your heartbeat on a monitor while you exercise.    For further information please visit [email or web address]. Please follow instruction sheet, as given.    Thank you for choosing [facility] at [facility]!!

## 2017-06-26 NOTE — Progress Notes (Signed)
Cardiology Office Note   Date:  06/26/2017   ID:  Alicia Garcia, DOB 1960-07-23, MRN [id number]  PCP:  Shirlean MylarWebb, Carol, MD  Cardiologist: Dr. Royann Shiversroitoru, new this office visit Theodore Demarkhonda Tessica Cupo, PA-C   Chief Complaint  Patient presents with  . Follow-up    pt was refer by Dr Hyman HopesWebb for Bradycardia    History of Present Illness: Alicia Garcia is a 56 y.o. female with a history of anemia, SEM, lupus  07/23 ER visit for chest pain, felt chest wall pain.  Naprosyn prescribed.  Bradycardia noted with a heart rate of 48  Alicia Garcia presents for cardiology evaluation.  She exercises regularly, does Pilates and sometimes does light weights.    She eats pretty well. Tries to eat a healthy diet.   She passed out once in high school, her blood sugar had dropped.   She has not gotten dizzy or light-headed recently. She had an episode about a year ago where she was sitting at the kitchen table and felt dizzy, it resolved spontaneously. Her husband has had multiple health issues, she wonders if that episode was stress related.  She has not had anything like that before or since.  She never gets chest pain with exertion. She had some CP after exertion, was evaluated. Chest wall tenderness noted, pt was told it was MS pain, but cards eval requested due to low heart rate.  She has no SOB w/ exertion, unless she has not worked out in a while and is carrying something up stairs.    Past Medical History:  Diagnosis Date  . Anemia   . Heart murmur    at birth, it closed up  . Lupus - trait only   . 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 COLONOSCOPY     2012, normal   . TONSILLECTOMY    . TUBAL LIGATION      Current Outpatient Medications  Medication Sig Dispense Refill  . aspirin EC 81 MG tablet Take 81 mg by mouth daily.    . naproxen (NAPROSYN) 500 MG tablet Take 1 tablet (500 mg total) by mouth 2 (two) times daily  with a meal. 20 tablet 0   No current facility-administered medications for this visit.     Allergies:   Patient has no known allergies.    Social History:  The patient  reports that  has never smoked. she has never used smokeless tobacco. She reports that she does not drink alcohol or use drugs.   Family History:  The patient's family history includes Heart attack (age of onset: 2661) in her mother; Heart attack (age of onset: 4564) in her father; Heart disease in her brother; Hypertension in her sister; Other (age of onset: 124) in her brother; Stroke (age of onset: 5540) in her maternal grandmother.    ROS:  Please see the history of present illness. All other systems are reviewed and negative.    PHYSICAL EXAM: VS:  BP 109/71   Pulse (!) 51   Ht 5\' 6"  (1.676 m)   Wt 166 lb 3.2 oz (75.4 kg)   BMI 26.83 kg/m  , BMI Body mass index is 26.83 kg/m. GEN: Well nourished, well developed, female in no acute distress  HEENT: normal for age  Neck: no JVD, no carotid bruit, no masses Cardiac: RRR; soft murmur, no rubs, or gallops Respiratory:  clear to auscultation bilaterally,  normal work of breathing GI: soft, nontender, nondistended, + BS MS: no deformity or atrophy; no edema; distal pulses are 2+ in all 4 extremities   Skin: warm and dry, no rash Neuro:  Strength and sensation are intact Psych: euthymic mood, full affect   EKG:  EKG is ordered today. The ekg ordered today demonstrates Sinus brady, HR 51.  No acute ischemic changes, normal intervals   Recent Labs: 12/09/2016: ALT 9; BUN 14; Creat 0.60; Hemoglobin 10.3; Platelets 251; Potassium 4.3; Sodium 138    Lipid Panel    Component Value Date/Time   LDLDIRECT 89 01/11/2015 1359     Wt Readings from Last 3 Encounters:  06/26/17 166 lb 3.2 oz (75.4 kg)  12/09/16 162 lb (73.5 kg)  10/20/16 165 lb (74.8 kg)     Other studies Reviewed: Additional studies/ records that were reviewed today include: Epic  records.  ASSESSMENT AND PLAN: The patient was reviewed with Dr. Royann Shiversroitoru who agrees with the plan.  1.  Bradycardia: She is asymptomatic at rest.  We will check a regular treadmill to evaluate for both ischemia and chronotropic incompetence.  If this is normal, no further workup.  She understands that if she starts getting lightheaded or dizzy or feels weak, she is to contact us.  Check an echocardiogram to make sure that she does not have any valvular or structural abnormalities causing the bradycardia.  She states that she has the lupus trait only, but I am not able to confirm this.  She states she has the thalassemia trait only, but has chronic anemia due to this.  2.  Strong family history of premature CAD: Her brother had bypass surgery at 1936 and both of her parents had CAD.  Do a screening treadmill.  3.  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   Current medicines are reviewed at length with the patient today.  The patient does not have concerns regarding medicines.  The following changes have been made:  no change  Labs/ tests ordered today include:   Orders Placed This Encounter  Procedures  . EXERCISE TOLERANCE TEST (ETT)  . EKG 12-Lead  . ECHOCARDIOGRAM COMPLETE     Disposition:   FU with Dr. Royann Shiversroitoru  Signed, Theodore Demarkhonda Aretta Stetzel, PA-C  06/26/2017 5:22 PM    [facility] Phone: [phone]([phone]; Fax: [phone]  This note was written with the assistance of speech recognition software. Please excuse any transcriptional errors.

## 2017-07-21 ENCOUNTER — Other Ambulatory Visit (HOSPITAL_COMMUNITY): Payer: 59

## 2017-07-21 ENCOUNTER — Inpatient Hospital Stay (HOSPITAL_COMMUNITY): Admission: RE | Admit: 2017-07-21 | Payer: 59 | Source: Ambulatory Visit

## 2017-07-29 ENCOUNTER — Telehealth (HOSPITAL_COMMUNITY): Payer: Self-pay

## 2017-07-29 NOTE — Telephone Encounter (Signed)
Encounter complete. 

## 2017-07-31 ENCOUNTER — Ambulatory Visit (HOSPITAL_BASED_OUTPATIENT_CLINIC_OR_DEPARTMENT_OTHER): Payer: 59

## 2017-07-31 ENCOUNTER — Ambulatory Visit (HOSPITAL_COMMUNITY)
Admission: RE | Admit: 2017-07-31 | Discharge: 2017-07-31 | Disposition: A | Discharge status: Home / Self Care | Payer: 59 | Source: Ambulatory Visit | Attending: Cardiology | Admitting: Cardiology

## 2017-07-31 ENCOUNTER — Other Ambulatory Visit: Payer: Self-pay

## 2017-07-31 ENCOUNTER — Encounter (INDEPENDENT_AMBULATORY_CARE_PROVIDER_SITE_OTHER): Payer: Self-pay

## 2017-07-31 DIAGNOSIS — R001 Bradycardia, unspecified: Secondary | ICD-10-CM | POA: Diagnosis present

## 2017-07-31 DIAGNOSIS — I5030 Unspecified diastolic (congestive) heart failure: Secondary | ICD-10-CM | POA: Insufficient documentation

## 2017-07-31 DIAGNOSIS — I420 Dilated cardiomyopathy: Secondary | ICD-10-CM | POA: Insufficient documentation

## 2017-07-31 DIAGNOSIS — I051 Rheumatic mitral insufficiency: Secondary | ICD-10-CM | POA: Diagnosis not present

## 2017-07-31 LAB — EXERCISE TOLERANCE TEST
CHL CUP RESTING HR STRESS: 55 {beats}/min
CSEPEW: 11.3 METS
Exercise duration (min): 9 min
Exercise duration (sec): 47 s
MPHR: 164 {beats}/min
Peak HR: 146 {beats}/min
Percent HR: 89 %
RPE: 17

## 2017-08-07 ENCOUNTER — Telehealth: Payer: Self-pay | Admitting: Physician Assistant

## 2017-08-07 NOTE — Telephone Encounter (Signed)
Returning [place] call from yesterday,concerning her Echo results.

## 2017-08-07 NOTE — Telephone Encounter (Signed)
Patient aware of echo results.

## 2017-09-30 DIAGNOSIS — E538 Deficiency of other specified B group vitamins: Secondary | ICD-10-CM | POA: Diagnosis not present

## 2017-09-30 DIAGNOSIS — R5382 Chronic fatigue, unspecified: Secondary | ICD-10-CM | POA: Diagnosis not present

## 2017-09-30 DIAGNOSIS — E611 Iron deficiency: Secondary | ICD-10-CM | POA: Diagnosis not present

## 2017-09-30 DIAGNOSIS — R001 Bradycardia, unspecified: Secondary | ICD-10-CM | POA: Diagnosis not present

## 2017-10-12 ENCOUNTER — Other Ambulatory Visit (HOSPITAL_COMMUNITY)
Admission: RE | Admit: 2017-10-12 | Discharge: 2017-10-12 | Disposition: A | Discharge status: Home / Self Care | Payer: BLUE CROSS/BLUE SHIELD | Source: Ambulatory Visit | Attending: Family Medicine | Admitting: Family Medicine

## 2017-10-12 ENCOUNTER — Other Ambulatory Visit: Payer: Self-pay | Admitting: Family Medicine

## 2017-10-12 DIAGNOSIS — Z124 Encounter for screening for malignant neoplasm of cervix: Secondary | ICD-10-CM | POA: Diagnosis not present

## 2017-10-12 DIAGNOSIS — Z01411 Encounter for gynecological examination (general) (routine) with abnormal findings: Secondary | ICD-10-CM | POA: Diagnosis not present

## 2017-10-12 DIAGNOSIS — Z0000 Encounter for general adult medical examination without abnormal findings: Secondary | ICD-10-CM | POA: Diagnosis not present

## 2017-10-14 LAB — CYTOLOGY - PAP
Adequacy: ABSENT
Diagnosis: NEGATIVE
HPV: NOT DETECTED

## 2017-11-11 ENCOUNTER — Other Ambulatory Visit: Payer: Self-pay

## 2017-11-11 ENCOUNTER — Telehealth: Payer: Self-pay | Admitting: Neurology

## 2017-11-11 ENCOUNTER — Ambulatory Visit: Payer: BLUE CROSS/BLUE SHIELD | Admitting: Neurology

## 2017-11-11 ENCOUNTER — Encounter: Payer: Self-pay | Admitting: Neurology

## 2017-11-11 VITALS — BP 106/72 | HR 54 | Ht 66.0 in | Wt 164.0 lb

## 2017-11-11 DIAGNOSIS — R202 Paresthesia of skin: Secondary | ICD-10-CM | POA: Diagnosis not present

## 2017-11-11 NOTE — Patient Instructions (Signed)
  We will check MRI of the brain and cervical spine. 

## 2017-11-11 NOTE — Telephone Encounter (Signed)
I spoke with patient, she is going to call back later to schedule MRI.

## 2017-11-11 NOTE — Progress Notes (Signed)
Reason for visit: Paresthesias  Referring physician: Dr. Alford Highland Alicia Garcia is a 57 y.o. female  History of present illness:  Alicia Garcia is a 57 year old right-handed black female with a history of onset of sensory alterations in the arms that began about 6 weeks ago.  The patient indicates that these changes started suddenly, with a vibration sensation that affected both arms, right greater than left.  The patient had some clumsiness of use of the hands as well.  She reported no onset of fatigue around the time of symptoms.  She denied any neck pain or pain down the arms, she did not have headaches, dizziness, vision changes or any trouble with memory.  She did report some difficulty with speech that gradually has resolved.  The patient denied any sensation alteration in the feet, but she did note some change in balance, she felt somewhat staggery, particularly when she was carrying a heavy object.  Over time, these symptoms have gradually improved, but not completely normalized.  The patient reports that she did not get any increase in sensory symptoms with any particular head movement or change in head position.  The patient reports no low back pain or pain down the legs, she denies any difficulty controlling the bowels or the bladder.  Blood work has been done recently that included a comprehensive metabolic profile, and she had a vitamin B12 level, she was told that the blood work was unremarkable.  The patient is sent to this office for an evaluation.  Past Medical History:  Diagnosis Date  . Anemia   . Heart murmur    at birth, it closed up  . Lupus - trait only   . 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 COLONOSCOPY     2012, normal   . TONSILLECTOMY    . TUBAL LIGATION      Family History  Problem Relation Age of Onset  . Heart attack Mother 19  . Heart attack Father 105  . Hypertension Sister        HBP onset @  55  . Other Brother 24       gunshot  . Stroke Maternal Grandmother 40       decsd @ 91  . Heart disease Brother        2 years younger, OHS at age 22    Social history:  reports that she has never smoked. She has never used smokeless tobacco. She reports that she does not drink alcohol or use drugs.  Medications:  Prior to Admission medications   Medication Sig Start Date End Date Taking? Authorizing Provider  acetaminophen (TYLENOL) 500 MG tablet Take 500 mg by mouth every 6 (six) hours as needed.   Yes [provider]  aspirin EC 81 MG tablet Take 81 mg by mouth daily.   Yes [provider]  ibuprofen (ADVIL,MOTRIN) 200 MG tablet Take 200 mg by mouth as needed.   Yes [provider]     No Known Allergies  ROS:  Out of a complete 14 system review of symptoms, the patient complains only of the following symptoms, and all other reviewed systems are negative.  Paresthesias  Blood pressure 106/72, pulse (!) 54, height  (1.676 m), weight 164 lb (74.4 kg).  Physical Exam  General: The patient is alert and cooperative at the time of the examination.  Eyes: Pupils are equal,  round, and reactive to light. Discs are flat bilaterally.  Neck: The neck is supple, no carotid bruits are noted.  Respiratory: The respiratory examination is clear.  Cardiovascular: The cardiovascular examination reveals a regular rate and rhythm, no obvious murmurs or rubs are noted.  Skin: Extremities are without significant edema.  Neurologic Exam  Mental status: The patient is alert and oriented x 3 at the time of the examination. The patient has apparent normal recent and remote memory, with an apparently normal attention span and concentration ability.  Cranial nerves: Facial symmetry is present. There is good sensation of the face to pinprick and soft touch bilaterally. The strength of the facial muscles and the muscles to head turning and shoulder shrug are  normal bilaterally. Speech is well enunciated, no aphasia or dysarthria is noted. Extraocular movements are full. Visual fields are full. The tongue is midline, and the patient has symmetric elevation of the soft palate. No obvious hearing deficits are noted.  Motor: The motor testing reveals 5 over 5 strength of all 4 extremities. Good symmetric motor tone is noted throughout.  Sensory: Sensory testing is intact to pinprick, soft touch, vibration sensation, and position sense on all 4 extremities. No evidence of extinction is noted.  Coordination: Cerebellar testing reveals good finger-nose-finger and heel-to-shin bilaterally.  Gait and station: Gait is normal. Tandem gait is normal. Romberg is negative. No drift is seen.  Reflexes: Deep tendon reflexes are symmetric and normal bilaterally. Toes are downgoing bilaterally.   Assessment/Plan:  1.  Paresthesias, bilateral upper extremities  The patient reported a relatively sudden onset of vibration sensations in both arms, clumsiness of the hands, alteration in balance, and difficulty with speech that have gradually improved over time.  The patient does have a history of lupus, followed by Dr. Corliss Skains.  The patient will be evaluated for possible cerebrovascular disease or demyelinating disease.  The patient will undergo MRI evaluation of the brain and cervical spinal cord with and without gadolinium enhancement.  If these studies are unremarkable, we will consider EMG and nerve conduction study evaluation.  Marlan Palau MD 11/11/2017 11:56 AM  [facility] [address] [place], [place] [address]  Phone [phone] Fax [phone]

## 2017-11-11 NOTE — Telephone Encounter (Signed)
Patients MRI Brain and MRI Cervical authorized [id number] (12/10/17). I called to schedule the patient and she did not answer so I left a VM asking her to call me back.

## 2017-11-26 NOTE — Progress Notes (Signed)
Office Visit Note  Patient: Alicia Garcia             Date of Birth: 12/22/1960           MRN: [id number]             PCP: Shirlean Mylar, MD Referring: Shirlean Mylar, MD Visit Date: 12/10/2017 Occupation: @    Subjective:  Thumb pain   History of Present Illness: Alicia Garcia is a 57 y.o. female with systemic lupus erythematosus and osteoarthritis.  Patient denies any recent lupus flares.  She denies any sores in her mouth is any swollen lymph nodes.  She denies any facial rashes or lesions on her skin.  She denies any sicca symptoms or symptoms of Raynaud's.  She denies any digital ulcerations.  She denies any shortness of breath or palpitations.  She denies any fatigue or low-grade fevers.  She states that she intermittently has discomfort in bilateral thumbs but she states she types all day.  She denies any joint swelling.  She denies any other joint pain at this time.  He states that her knees have been doing well.  She continues to take aspirin 81 mg daily.  She states that she has not been taking vitamin D but will start again. She reports that she recently saw Dr. Anne Hahn and neurologist for evaluation of gait instability as well as vibratory sensation in her hands.  She states that she is scheduled for an MRI of her C-spine as well as her brain.  Activities of Daily Living:  Patient reports morning stiffness for 0  minutes.   Patient Denies nocturnal pain.  Difficulty dressing/grooming: Denies Difficulty climbing stairs: Denies Difficulty getting out of chair: Denies Difficulty using hands for taps, buttons, cutlery, and/or writing: Reports   Review of Systems  Constitutional: Negative for fatigue.  HENT: Negative for mouth sores, mouth dryness and nose dryness.   Eyes: Negative for pain, visual disturbance and dryness.  Respiratory: Negative for cough, hemoptysis, shortness of breath and difficulty breathing.   Cardiovascular: Negative for chest pain, palpitations,  hypertension and swelling in legs/feet.  Gastrointestinal: Negative for blood in stool, constipation and diarrhea.  Endocrine: Negative for increased urination.  Genitourinary: Negative for painful urination.  Musculoskeletal: Positive for arthralgias and joint pain. Negative for joint swelling, myalgias, muscle weakness, morning stiffness, muscle tenderness and myalgias.  Skin: Negative for color change, pallor, rash, hair loss, nodules/bumps, skin tightness, ulcers and sensitivity to sunlight.  Allergic/Immunologic: Negative for susceptible to infections.  Neurological: Positive for dizziness (Seeing neurology, MRI scheduled ). Negative for numbness, headaches and weakness.  Hematological: Negative for swollen glands.  Psychiatric/Behavioral: Positive for sleep disturbance. Negative for depressed mood. The patient is not nervous/anxious.     PMFS History:  Patient Active Problem List   Diagnosis Date Noted  . 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 Thalassemia 08/13/2016  . Vitamin D deficiency 06/24/2016  . Systemic lupus erythematosus (HCC) 06/24/2016  . Primary osteoarthritis of both knees 06/24/2016    Past Medical History:  Diagnosis Date  . Anemia   . Heart murmur    at birth, it closed up  . Lupus - trait only   . Thalassemia trait    causes her anemia    Family History  Problem Relation Age of Onset  . Heart attack Mother 49  . Heart attack Father 1  . Hypertension Sister        HBP onset @ 55  . Other Brother 3  gunshot  . Stroke Maternal Grandmother 40       decsd @ 91  . Heart disease Brother        28 years younger, OHS at age 72  . Healthy Daughter   . Healthy Daughter    Past Surgical History:  Procedure Laterality Date  . COLONOSCOPY     2012, normal   . TONSILLECTOMY    . TUBAL LIGATION     Social History   Social History Narrative   Lives in [place] with husband and cat.   Caffeine use: none   Right handed       Objective: Vital Signs: BP 95/67 (BP Location: Left Arm, Patient Position: Sitting, Cuff Size: Normal)   Pulse (!) 56   Resp 13   Ht  (1.676 m)   Wt 166 lb (75.3 kg)   BMI 26.79 kg/m    Physical Exam  Constitutional: She is oriented to person, place, and time. She appears well-developed and well-nourished.  HENT:  Head: Normocephalic and atraumatic.  Eyes: Conjunctivae and EOM are normal.  Neck: Normal range of motion.  Cardiovascular: Normal rate, regular rhythm, normal heart sounds and intact distal pulses.  Pulmonary/Chest: Effort normal and breath sounds normal.  Abdominal: Soft. Bowel sounds are normal.  Lymphadenopathy:    She has no cervical adenopathy.  Neurological: She is alert and oriented to person, place, and time.  Skin: Skin is warm and dry. Capillary refill takes less than 2 seconds.  Psychiatric: She has a normal mood and affect. Her behavior is normal.  Nursing note and vitals reviewed.    Musculoskeletal Exam: C-spine, thoracic spine, lumbar spine good range of motion.  No midline spinal tenderness.  No SI joint tenderness.  Shoulder joints, elbow joints, wrist joints, MCPs, PIPs, DIPs good range of motion with no synovitis. She has mild tenderness in bilateral 1st MCP joints but no synovitis.  Hip joints, knee joints, ankle joints, MCPs, PIPs, and DIPs good ROM with no synovitis.  No warmth or effusion of knee joints.  Bilateral knee crepitus.  No tenderness of trochanteric bursa bilaterally.    CDAI Exam: No CDAI exam completed.    Investigation: No additional findings.   Imaging: No results found.  Speciality Comments: No specialty comments available.    Procedures:  No procedures performed Allergies: Patient has no known allergies.   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 -She has not had any recent lupus flares.  She  continues to take aspirin 81 mg daily due to the history of anticardiolipin antibody positive.  She has no active synovitis.  She said intermittent discomfort in her bilateral first MCP joints but no swelling.  She has not had any recent facial rashes or lesions on her skin.  We discussed the importance of wearing sunscreen but she is allergic to all sunscreens.  She denies photosensitivity. She has no oral or nasal ulcerations on exam today.  She has no cervical lymphadenopathy.  No symptoms of Raynaud's.  No digital ulcerations or signs of gangrene.  No sicca symptoms.  She denies any recent shortness of breath or palpitations.  She has had no recurrence of pleuritis.  We will check autoimmune labs today.  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 Plan: Cardiolipin antibodies, IgG, IgM, IgA  Primary osteoarthritis of both knees:  No warmth or effusion of knee joints. Bilateral knee crepitus.  She has no discomfort in her knee joints.   Thalassemia, unspecified type  History of vitamin D deficiency: We are checking vitamin D today.  She has not been taking a vitamin D supplement recently.     Orders: Orders Placed This Encounter  Procedures  . CBC with Differential/Platelet  . COMPLETE METABOLIC PANEL WITH GFR  . Urinalysis, Routine w reflex microscopic  . ANA  . Anti-DNA antibody, double-stranded  . C3 and C4  . Sedimentation rate  . VITAMIN D 25 Hydroxy (Vit-D Deficiency, Fractures)  . Cardiolipin antibodies, IgG, IgM, IgA   No orders of the defined types were placed in this encounter.     Follow-Up Instructions: Return in about 1 year (around 12/11/2018) for Systemic lupus erythematosus,  Osteoarthritis.   Gearldine Bienenstock, PA-C  Note - This record has been created using Dragon software.  Chart creation errors have been sought, but may not always  have been located. Such creation errors do not reflect on  the standard of medical care.

## 2017-12-10 ENCOUNTER — Encounter: Payer: Self-pay | Admitting: Rheumatology

## 2017-12-10 ENCOUNTER — Ambulatory Visit: Payer: BLUE CROSS/BLUE SHIELD | Admitting: Rheumatology

## 2017-12-10 VITALS — BP 95/67 | HR 56 | Resp 13 | Ht 66.0 in | Wt 166.0 lb

## 2017-12-10 DIAGNOSIS — R760 Raised antibody titer: Secondary | ICD-10-CM

## 2017-12-10 DIAGNOSIS — M170 Bilateral primary osteoarthritis of knee: Secondary | ICD-10-CM

## 2017-12-10 DIAGNOSIS — M3213 Lung involvement in systemic lupus erythematosus: Secondary | ICD-10-CM

## 2017-12-10 DIAGNOSIS — D569 Thalassemia, unspecified: Secondary | ICD-10-CM | POA: Diagnosis not present

## 2017-12-10 DIAGNOSIS — Z8639 Personal history of other endocrine, nutritional and metabolic disease: Secondary | ICD-10-CM | POA: Diagnosis not present

## 2017-12-14 LAB — CBC WITH DIFFERENTIAL/PLATELET
BASOS PCT: 0.9 %
Basophils Absolute: 50 cells/uL (ref 0–200)
EOS PCT: 2 %
Eosinophils Absolute: 110 cells/uL (ref 15–500)
HCT: 33.4 % — ABNORMAL LOW (ref 35.0–45.0)
Hemoglobin: 10.3 g/dL — ABNORMAL LOW (ref 11.7–15.5)
Lymphs Abs: 1628 cells/uL (ref 850–3900)
MCH: 23 pg — AB (ref 27.0–33.0)
MCHC: 30.8 g/dL — ABNORMAL LOW (ref 32.0–36.0)
MCV: 74.6 fL — ABNORMAL LOW (ref 80.0–100.0)
MONOS PCT: 9.5 %
MPV: 11.3 fL (ref 7.5–12.5)
Neutro Abs: 3190 cells/uL (ref 1500–7800)
Neutrophils Relative %: 58 %
PLATELETS: 261 10*3/uL (ref 140–400)
RBC: 4.48 10*6/uL (ref 3.80–5.10)
RDW: 12.9 % (ref 11.0–15.0)
TOTAL LYMPHOCYTE: 29.6 %
WBC mixed population: 523 cells/uL (ref 200–950)
WBC: 5.5 10*3/uL (ref 3.8–10.8)

## 2017-12-14 LAB — URINALYSIS, ROUTINE W REFLEX MICROSCOPIC
Bacteria, UA: NONE SEEN /HPF
Bilirubin Urine: NEGATIVE
GLUCOSE, UA: NEGATIVE
HYALINE CAST: NONE SEEN /LPF
Ketones, ur: NEGATIVE
Leukocytes, UA: NEGATIVE
Nitrite: NEGATIVE
PROTEIN: NEGATIVE
SPECIFIC GRAVITY, URINE: 1.025 (ref 1.001–1.03)
Squamous Epithelial / LPF: NONE SEEN /HPF (ref <=5)
pH: 6 (ref 5.0–8.0)

## 2017-12-14 LAB — C3 AND C4
C3 Complement: 115 mg/dL (ref 83–193)
C4 COMPLEMENT: 24 mg/dL (ref 15–57)

## 2017-12-14 LAB — COMPLETE METABOLIC PANEL WITH GFR
AG Ratio: 1.3 (calc) (ref 1.0–2.5)
ALT: 10 U/L (ref 6–29)
AST: 16 U/L (ref 10–35)
Albumin: 4.5 g/dL (ref 3.6–5.1)
Alkaline phosphatase (APISO): 82 U/L (ref 33–130)
BILIRUBIN TOTAL: 0.7 mg/dL (ref 0.2–1.2)
BUN: 16 mg/dL (ref 7–25)
CHLORIDE: 104 mmol/L (ref 98–110)
CO2: 27 mmol/L (ref 20–32)
Calcium: 9.6 mg/dL (ref 8.6–10.4)
Creat: 0.65 mg/dL (ref 0.50–1.05)
GFR, EST AFRICAN AMERICAN: 114 mL/min/{1.73_m2} (ref >=60)
GFR, Est Non African American: 99 mL/min/{1.73_m2} (ref >=60)
GLUCOSE: 84 mg/dL (ref 65–99)
Globulin: 3.4 g/dL (calc) (ref 1.9–3.7)
Potassium: 4.3 mmol/L (ref 3.5–5.3)
Sodium: 138 mmol/L (ref 135–146)
TOTAL PROTEIN: 7.9 g/dL (ref 6.1–8.1)

## 2017-12-14 LAB — VITAMIN D 25 HYDROXY (VIT D DEFICIENCY, FRACTURES): VIT D 25 HYDROXY: 29 ng/mL — AB (ref 30–100)

## 2017-12-14 LAB — CARDIOLIPIN ANTIBODIES, IGG, IGM, IGA
Anticardiolipin IgG: <14 [GPL'U]
Anticardiolipin IgM: 12 [MPL'U]

## 2017-12-14 LAB — SEDIMENTATION RATE: SED RATE: 46 mm/h — AB (ref 0–30)

## 2017-12-14 LAB — ANTI-NUCLEAR AB-TITER (ANA TITER): ANA Titer 1: 1:160 {titer} — ABNORMAL HIGH

## 2017-12-14 LAB — ANTI-DNA ANTIBODY, DOUBLE-STRANDED: ds DNA Ab: 1 IU/mL

## 2017-12-14 LAB — ANA: Anti Nuclear Antibody(ANA): POSITIVE — AB

## 2017-12-14 NOTE — Progress Notes (Signed)
Anemia is stable.  Sed rate mildly elevated.  ANA titer 1:160 which is nonspecific.  Complements and dsDNA are WNL.   Vitamin D is mildly low.  Please send in vitamin D 50,000 units by mouth once weekly.  We will recheck in 3 months.   UA revealed trace hgb please ask if patient was on menstrual cycle.

## 2017-12-16 ENCOUNTER — Telehealth: Payer: Self-pay | Admitting: *Deleted

## 2017-12-16 DIAGNOSIS — E559 Vitamin D deficiency, unspecified: Secondary | ICD-10-CM

## 2017-12-16 MED ORDER — VITAMIN D (ERGOCALCIFEROL) 1.25 MG (50000 UNIT) PO CAPS: 50000.0000 [IU] | ORAL_CAPSULE | ORAL | 0 refills | Status: DC

## 2017-12-16 NOTE — Telephone Encounter (Signed)
-----   Message from Gearldine Bienenstockaylor M Dale, PA-C sent at 12/14/2017  1:26 PM EDT ----- Anemia is stable.  Sed rate mildly elevated.  ANA titer 1:160 which is nonspecific.  Complements and dsDNA are WNL.   Vitamin D is mildly low.  Please send in vitamin D 50,000 units by mouth once weekly.  We will recheck in 3 months.   UA revealed trace hgb please ask if patient was on menstrual cycle.

## 2018-02-01 ENCOUNTER — Encounter (HOSPITAL_COMMUNITY): Payer: Self-pay

## 2018-02-01 ENCOUNTER — Ambulatory Visit (HOSPITAL_COMMUNITY)
Admission: EM | Admit: 2018-02-01 | Discharge: 2018-02-01 | Disposition: A | Discharge status: Home / Self Care | Payer: BLUE CROSS/BLUE SHIELD | Attending: Family Medicine | Admitting: Family Medicine

## 2018-02-01 DIAGNOSIS — R42 Dizziness and giddiness: Secondary | ICD-10-CM | POA: Diagnosis not present

## 2018-02-01 DIAGNOSIS — R001 Bradycardia, unspecified: Secondary | ICD-10-CM

## 2018-02-01 LAB — POCT I-STAT, CHEM 8
BUN: 9 mg/dL (ref 6–20)
CALCIUM ION: 1.26 mmol/L (ref 1.15–1.40)
CREATININE: 0.7 mg/dL (ref 0.44–1.00)
Chloride: 104 mmol/L (ref 98–111)
GLUCOSE: 88 mg/dL (ref 70–99)
HCT: 35 % — ABNORMAL LOW (ref 36.0–46.0)
Hemoglobin: 11.9 g/dL — ABNORMAL LOW (ref 12.0–15.0)
Potassium: 4.3 mmol/L (ref 3.5–5.1)
Sodium: 141 mmol/L (ref 135–145)
TCO2: 27 mmol/L (ref 22–32)

## 2018-02-01 MED ORDER — KETOROLAC TROMETHAMINE 60 MG/2ML IM SOLN
60.0000 mg | Freq: Once | INTRAMUSCULAR | Status: AC
  Administered 2018-02-01: 60 mg via INTRAMUSCULAR

## 2018-02-01 MED ORDER — FLUTICASONE PROPIONATE 50 MCG/ACT NA SUSP: 1.0000 | Freq: Every day | NASAL | 2 refills | Status: DC

## 2018-02-01 MED ORDER — KETOROLAC TROMETHAMINE 60 MG/2ML IM SOLN
INTRAMUSCULAR | Status: AC
  Filled 2018-02-01: qty 2

## 2018-02-01 NOTE — Discharge Instructions (Addendum)
We will treat your head pressure with antiinflammatory here today.  May also try using a daily nasal spray, flonase which may help with the pressure sensation.  Please follow up with neurology with MRI as ordered as well as for recheck. Your heart rate remains quite low here today, I would also recommend a follow up with cardiology as you did have an episode today of dizziness which may be attributed to low heart rate.  If you develop repeated symptoms, increased symptoms such as weakness, dizziness, headache, vision changes, confusion, slurred speech, nausea or vomiting please return or go to Er.

## 2018-02-01 NOTE — ED Triage Notes (Signed)
Pt presents with unknown dizziness

## 2018-02-01 NOTE — ED Provider Notes (Signed)
[facility]    CSN: [id number] Arrival date & time: 02/01/18  [id number]     History   Chief Complaint Chief Complaint  Patient presents with  . Dizziness    HPI Alicia Garcia is a 57 y.o. female.   Alicia NickelBeatrice presents today with complaints of an episode of dizziness this morning while she was driving. States she felt dizzy and like the road was curving up. She pulled the car over and stopped and symptoms resolved in approximately 3-5 minutes. No longer feels dizzy, feels much improved. Has mild right facial and temporal "pressure" which she feels she has noticed since the episode. States feels similar to other episodes she has had with her sinuses. No chest pain , palpitations, shortness of breath , vision loss or change, weakness. States has had episode of dizziness years ago but not similar with the sensation of the road curving up. No head injury. No nausea, vomiting or diarrhea. Feels well now. No weakness. Has been seen by neurology recently and is to schedule an MRI of her head for paresthesias, has not yet done this.  History of lupus, follows with rheumatology, anemia and thalassemia, heart murmur, and bradycardia- completed cardiac work up 06/2017 with echo and stress test. Hgb &Hct improved today in comparison to last draw 12/10/17. Takes a baby aspirin and weekly vita d.    ROS per HPI.      Past Medical History:  Diagnosis Date  . Anemia   . Heart murmur    at birth, it closed up  . Lupus - trait only   . Thalassemia trait    causes her anemia    Patient Active Problem List   Diagnosis Date Noted  . 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 Thalassemia 08/13/2016  . Vitamin D deficiency 06/24/2016  . Systemic lupus erythematosus (HCC) 06/24/2016  . Primary osteoarthritis of both knees 06/24/2016    Past Surgical History:  Procedure Laterality Date  . COLONOSCOPY     2012, normal   . TONSILLECTOMY    . TUBAL LIGATION      OB History     None      Home Medications    Prior to Admission medications   Medication Sig Start Date End Date Taking? Authorizing Provider  acetaminophen (TYLENOL) 500 MG tablet Take 500 mg by mouth every 6 (six) hours as needed.    [provider]  aspirin EC 81 MG tablet Take 81 mg by mouth daily.    [provider]  fluticasone (FLONASE) 50 MCG/ACT nasal spray Place 1 spray into both nostrils daily. 02/01/18   Georgetta HaberBurky, Natalie B, NP  ibuprofen (ADVIL,MOTRIN) 200 MG tablet Take 200 mg by mouth as needed.    [provider]  Vitamin D, Ergocalciferol, (DRISDOL) 50000 units CAPS capsule Take 1 capsule (50,000 Units total) by mouth every 7 (seven) days. 12/16/17   Pollyann Savoyeveshwar, Shaili, MD    Family History Family History  Problem Relation Age of Onset  . Heart attack Mother 5861  . Heart attack Father 2064  . Hypertension Sister        HBP onset @ 55  . Other Brother 24       gunshot  . Stroke Maternal Grandmother 40       decsd @ 91  . Heart disease Brother        8113 years younger, OHS at age 57  . Healthy Daughter   . Healthy Daughter     Social  History Social History   Tobacco Use  . Smoking status: Never Smoker  . Smokeless tobacco: Never Used  Substance Use Topics  . Alcohol use: No    Alcohol/week: 0.0 oz  . Drug use: No     Allergies   Patient has no known allergies.   Review of Systems Review of Systems   Physical Exam Triage Vital Signs ED Triage Vitals  Enc Vitals Group     BP 02/01/18 0839 121/75     Pulse Rate 02/01/18 0839 61     Resp 02/01/18 0839 18     Temp 02/01/18 0839 98.2 F (36.8 C)     Temp Source 02/01/18 0839 Oral     SpO2 02/01/18 0839 100 %     Weight --      Height --      Head Circumference --      Peak Flow --      Pain Score 02/01/18 0843 0     Pain Loc --      Pain Edu? --      Excl. in GC? --    Orthostatic VS for the past 24 hrs:  BP- Lying Pulse- Lying BP- Sitting Pulse- Sitting  02/01/18 0852 118/68 (!)  47 122/72 (!) 48    Updated Vital Signs BP 121/75 (BP Location: Left Arm)   Pulse 61   Temp 98.2 F (36.8 C) (Oral)   Resp 18   SpO2 100%   Physical Exam  Constitutional: She is oriented to person, place, and time. She appears well-developed and well-nourished. No distress.  HENT:  Head: Normocephalic and atraumatic.  Right Ear: External ear normal.  Left Ear: External ear normal.  Mouth/Throat: Oropharynx is clear and moist.  Eyes: Pupils are equal, round, and reactive to light. Conjunctivae and EOM are normal.  Neck: Normal range of motion.  Cardiovascular: Regular rhythm and normal pulses. Bradycardia present.  Pulmonary/Chest: Effort normal and breath sounds normal.  Musculoskeletal: Normal range of motion.  Neurological: She is alert and oriented to person, place, and time. She has normal strength. She is not disoriented. No cranial nerve deficit or sensory deficit. She displays a negative Romberg sign. Coordination and gait normal. GCS eye subscore is 4. GCS verbal subscore is 5. GCS motor subscore is 6.  Skin: Skin is warm and dry.   ekg sinus bradycardia rate 44 without acute changes noted  UC Treatments / Results  Labs (all labs ordered are listed, but only abnormal results are displayed) Labs Reviewed  POCT I-STAT, CHEM 8 - Abnormal; Notable for the following components:      Result Value   Hemoglobin 11.9 (*)    HCT 35.0 (*)    All other components within normal limits    EKG None  Radiology No results found.  Procedures Procedures (including critical care time)  Medications Ordered in UC Medications  ketorolac (TORADOL) injection 60 mg (60 mg Intramuscular Given 02/01/18 0927)    Initial Impression / Assessment and Plan / UC Course  I have reviewed the triage vital signs and the nursing notes.  Pertinent labs & imaging results that were available during my care of the patient were reviewed by me and considered in my medical decision making (see chart  for details).    Chem 8 and orthostatic vitals reassuring. No longer with symptoms. Normal neurological exam at this time. Non toxic in appearance. EKG reassuring but with bradycardia. Has been evaluated by cardiology for this in the past but  was primarily asymptomatic. Currently without any dizziness, rate 44 on EKG. Recommend follow up for recheck with neurology as well as with cardiology as both areas may be contributing to symptoms. Currently asymptomatic, no red flag findings on exam. Stable. Ambulatory. Discussed strict return precautions. Patient verbalized understanding and agreeable to plan.  Ambulatory out of clinic without difficulty.    Final Clinical Impressions(s) / UC Diagnoses   Final diagnoses:  Dizziness     Discharge Instructions     We will treat your head pressure with antiinflammatory here today.  May also try using a daily nasal spray, flonase which may help with the pressure sensation.  Please follow up with neurology with MRI as ordered as well as for recheck. Your heart rate remains quite low here today, I would also recommend a follow up with cardiology as you did have an episode today of dizziness which may be attributed to low heart rate.  If you develop repeated symptoms, increased symptoms such as weakness, dizziness, headache, vision changes, confusion, slurred speech, nausea or vomiting please return or go to Er.     ED Prescriptions    Medication Sig Dispense Auth. Provider   fluticasone (FLONASE) 50 MCG/ACT nasal spray Place 1 spray into both nostrils daily. 16 g Georgetta Haber, NP     Controlled Substance Prescriptions [place] Controlled Substance Registry consulted? Not Applicable   Georgetta Haber, NP 02/01/18 [phone]

## 2018-02-02 ENCOUNTER — Telehealth: Payer: Self-pay | Admitting: Neurology

## 2018-02-02 NOTE — Telephone Encounter (Signed)
MR Brain w/wo contrast & MR Cervical spine w/wo contarst Dr. Lockie MolaWillis BCBS Auth: [id number] (exp. 02/01/18 to 03/02/18). Patient called in wanting to schedule her MRI's now. [facility]

## 2018-02-16 ENCOUNTER — Ambulatory Visit: Payer: BLUE CROSS/BLUE SHIELD

## 2018-02-16 DIAGNOSIS — R202 Paresthesia of skin: Secondary | ICD-10-CM

## 2018-02-16 MED ORDER — GADOPENTETATE DIMEGLUMINE 469.01 MG/ML IV SOLN
16.0000 mL | Freq: Once | INTRAVENOUS | Status: AC | PRN
  Administered 2018-02-16: 16 mL via INTRAVENOUS

## 2018-02-18 ENCOUNTER — Telehealth: Payer: Self-pay | Admitting: Neurology

## 2018-02-18 DIAGNOSIS — R202 Paresthesia of skin: Secondary | ICD-10-CM

## 2018-02-18 NOTE — Telephone Encounter (Signed)
  I called the patient.  The MRI of the cervical spine and brain are unremarkable, no explanation for her sensory symptoms in the arms, I will set her up for EMG and nerve conduction study evaluation.   MRI cervical 02/16/18:  IMPRESSION:   MRI cervical spine (with and without) demonstrating: - Minimal degenerative changes as above. No spinal stenosis or foraminal narrowing. - No intrinsic, compressive or abnormal enhancing spinal cord lesions.   MRI brain 02/16/18:  IMPRESSION:   Normal MRI brain (with and without).

## 2018-03-03 ENCOUNTER — Encounter: Payer: Self-pay | Admitting: Neurology

## 2018-03-03 ENCOUNTER — Ambulatory Visit: Payer: BLUE CROSS/BLUE SHIELD | Admitting: Neurology

## 2018-03-03 ENCOUNTER — Ambulatory Visit (INDEPENDENT_AMBULATORY_CARE_PROVIDER_SITE_OTHER): Payer: BLUE CROSS/BLUE SHIELD | Admitting: Neurology

## 2018-03-03 DIAGNOSIS — R202 Paresthesia of skin: Secondary | ICD-10-CM | POA: Diagnosis not present

## 2018-03-03 NOTE — Procedures (Signed)
     HISTORY:  Alicia GrainBeatrice Brouwer is a 57 year old patient with a history of episodes of paresthesias involving both upper extremities, she has some clumsiness with using the right hand.  The patient is being evaluated for these issues.  NERVE CONDUCTION STUDIES:  Nerve conduction studies were performed on both upper extremities. The distal motor latencies and motor amplitudes for the median and ulnar nerves were within normal limits. The nerve conduction velocities for these nerves were also normal. The sensory latencies for the median and ulnar nerves were normal. The F wave latencies for the ulnar nerves were within normal limits.   EMG STUDIES:  EMG study was performed on the right upper extremity:  The first dorsal interosseous muscle reveals 2 to 4 K units with full recruitment. No fibrillations or positive waves were noted. The abductor pollicis brevis muscle reveals 2 to 4 K units with full recruitment. No fibrillations or positive waves were noted. The extensor indicis proprius muscle reveals 1 to 3 K units with full recruitment. No fibrillations or positive waves were noted. The pronator teres muscle reveals 2 to 3 K units with full recruitment. No fibrillations or positive waves were noted. The biceps muscle reveals 1 to 2 K units with full recruitment. No fibrillations or positive waves were noted. The triceps muscle reveals 2 to 4 K units with full recruitment. No fibrillations or positive waves were noted. The anterior deltoid muscle reveals 2 to 3 K units with full recruitment. No fibrillations or positive waves were noted. The cervical paraspinal muscles were tested at 2 levels. No abnormalities of insertional activity were seen at either level tested. There was poor relaxation.   IMPRESSION:  Nerve conduction studies done on both upper extremities are within normal limits.  No evidence of a neuropathy is seen.  EMG evaluation of the right upper extremity is unremarkable, no  evidence of an overlying cervical radiculopathy is seen.  Marlan Palau. Keith Melysa Schroyer MD 03/03/2018 4:09 PM  [facility] [address] [place], [place] [id number]  Phone [phone] Fax [phone]

## 2018-03-03 NOTE — Progress Notes (Signed)
[facility]    Nerve / Sites Muscle Latency Ref. Amplitude Ref. Rel Amp Segments Distance Velocity Ref. Area    ms ms mV mV %  cm m/s m/s mVms  L Median - APB     Wrist APB 3.4 ?4.4 9.6 ?4.0 100 Wrist - APB 7   35.1     Upper arm APB 7.9  9.1  95 Upper arm - Wrist 24 54 ?49 35.6  R Median - APB     Wrist APB 3.2 ?4.4 9.1 ?4.0 100 Wrist - APB 7   31.9     Upper arm APB 7.6  8.7  95.3 Upper arm - Wrist 24 56 ?49 31.3  L Ulnar - ADM     Wrist ADM 2.6 ?3.3 8.7 ?6.0 100 Wrist - ADM 7   36.7     B.Elbow ADM 6.6  8.6  98.4 B.Elbow - Wrist 22 55 ?49 37.5     A.Elbow ADM 8.4  7.6  88.6 A.Elbow - B.Elbow 10 55 ?49 35.6         A.Elbow - Wrist      R Ulnar - ADM     Wrist ADM 2.8 ?3.3 8.6 ?6.0 100 Wrist - ADM 7   33.5     B.Elbow ADM 6.6  8.6  100 B.Elbow - Wrist 22 58 ?49 32.4     A.Elbow ADM 8.2  8.4  97.6 A.Elbow - B.Elbow 10 60 ?49 32.2         A.Elbow - Wrist                 SNC    Nerve / Sites Rec. Site Peak Lat Ref.  Amp Ref. Segments Distance    ms ms V V  cm  L Median - Orthodromic (Dig II, Mid palm)     Dig II Wrist 3.2 ?3.4 11 ?10 Dig II - Wrist 13  R Median - Orthodromic (Dig II, Mid palm)     Dig II Wrist 3.1 ?3.4 12 ?10 Dig II - Wrist 13  L Ulnar - Orthodromic, (Dig V, Mid palm)     Dig V Wrist 2.8 ?3.1 5 ?5 Dig V - Wrist 11  R Ulnar - Orthodromic, (Dig V, Mid palm)     Dig V Wrist 2.6 ?3.1 9 ?5 Dig V - Wrist 4111              F  Wave    Nerve F Lat Ref.   ms ms  L Ulnar - ADM 29.0 ?32.0  R Ulnar - ADM 28.3 ?32.0

## 2018-03-03 NOTE — Progress Notes (Signed)
Please refer to EMG and nerve conduction study procedure note. 

## 2018-03-05 LAB — LYME, WESTERN BLOT, SERUM (REFLEXED)
IGG P23 AB.: ABSENT
IGG P28 AB.: ABSENT
IGG P39 AB.: ABSENT
IGG P41 AB.: ABSENT
IGG P93 AB.: ABSENT
IGM P41 AB.: ABSENT
IgG P18 Ab.: ABSENT
IgG P30 Ab.: ABSENT
IgG P45 Ab.: ABSENT
IgG P58 Ab.: ABSENT
IgG P66 Ab.: ABSENT
IgM P23 Ab.: ABSENT
IgM P39 Ab.: ABSENT
LYME IGG WB: NEGATIVE
Lyme IgM Wb: NEGATIVE

## 2018-03-05 LAB — B. BURGDORFI ANTIBODIES: LYME IGG/IGM AB: 0.92 {ISR} — AB (ref 0.00–0.90)

## 2018-03-05 LAB — HIV ANTIBODY (ROUTINE TESTING W REFLEX): HIV Screen 4th Generation wRfx: NONREACTIVE

## 2018-03-05 LAB — ANGIOTENSIN CONVERTING ENZYME: Angio Convert Enzyme: 24 U/L (ref 14–82)

## 2018-03-05 LAB — COPPER, SERUM: Copper: 134 ug/dL (ref 72–166)

## 2018-03-08 ENCOUNTER — Telehealth: Payer: Self-pay | Admitting: *Deleted

## 2018-03-08 NOTE — Telephone Encounter (Signed)
Called and spoke with pt about unremarkable labs per CW,MD note. Pt verbalized understanding.  

## 2018-03-08 NOTE — Telephone Encounter (Signed)
-----   Message from York Spanielharles K Willis, MD sent at 03/07/2018  6:12 PM EDT -----  The blood work results are unremarkable.  ----- Message ----- From: Nell RangeInterface, [facility] Lab Results In Sent: 03/04/2018   7:39 AM EDT To: York Spanielharles K Willis, MD

## 2018-07-26 IMAGING — DX DG FOOT COMPLETE 3+V*L*
3 series · 3 of 3 positions shown · non-contrast
Comparison: No recent prior .

CLINICAL DATA: Inversion injury.

EXAM:
LEFT FOOT - COMPLETE 3+ VIEW

[foot ap]
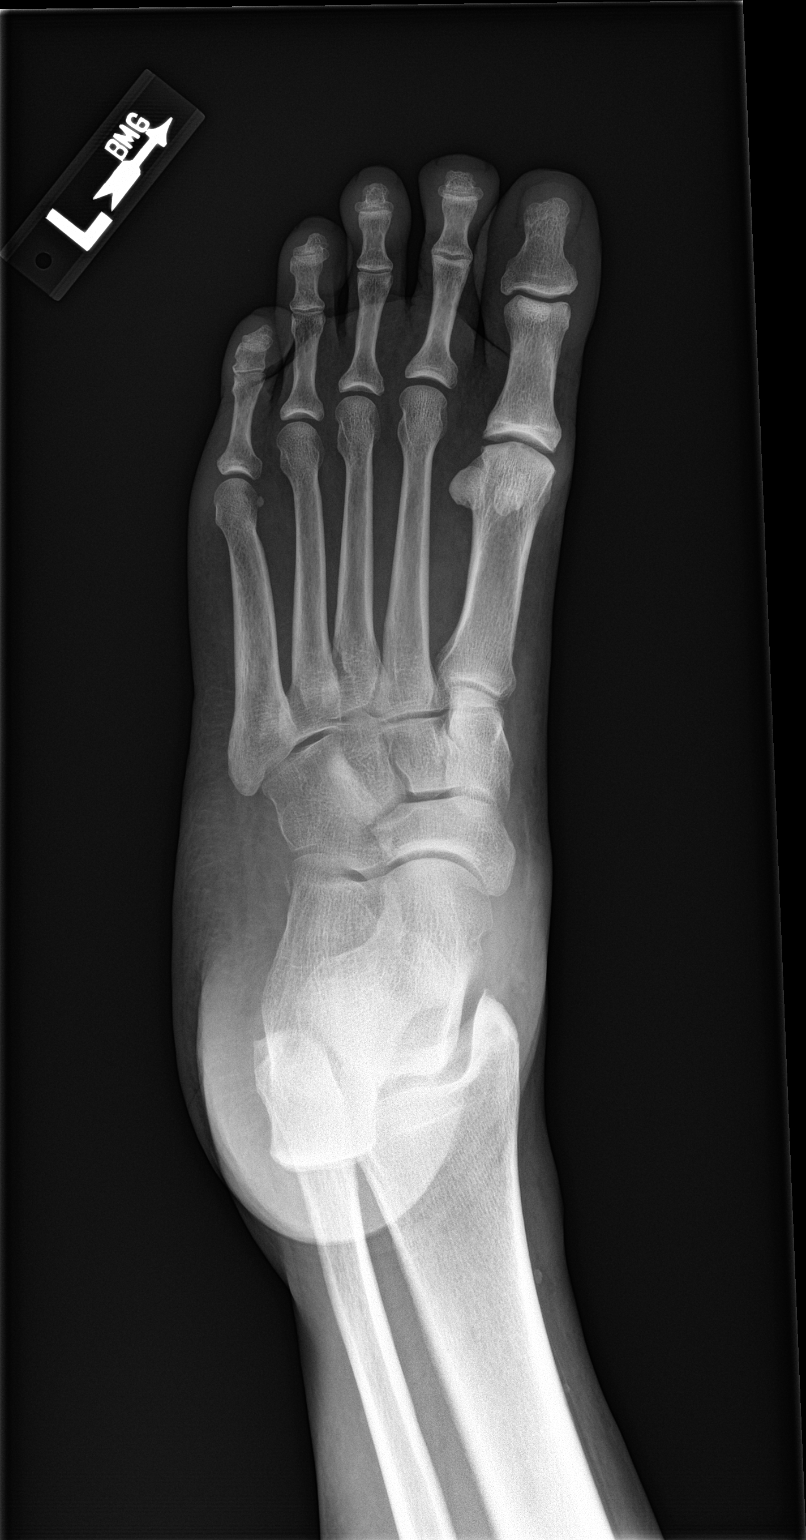

[foot obl]
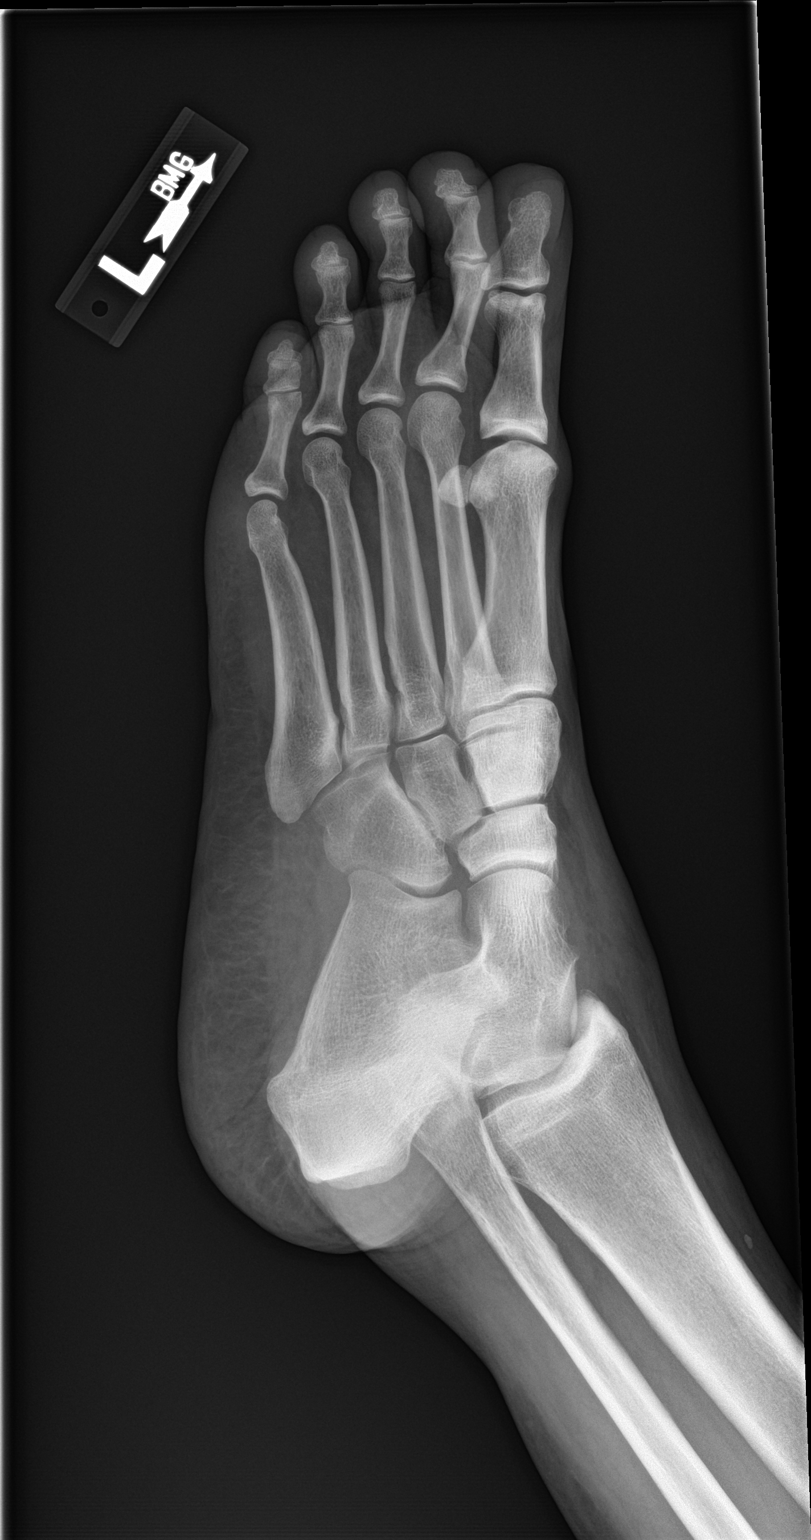

[foot lat]
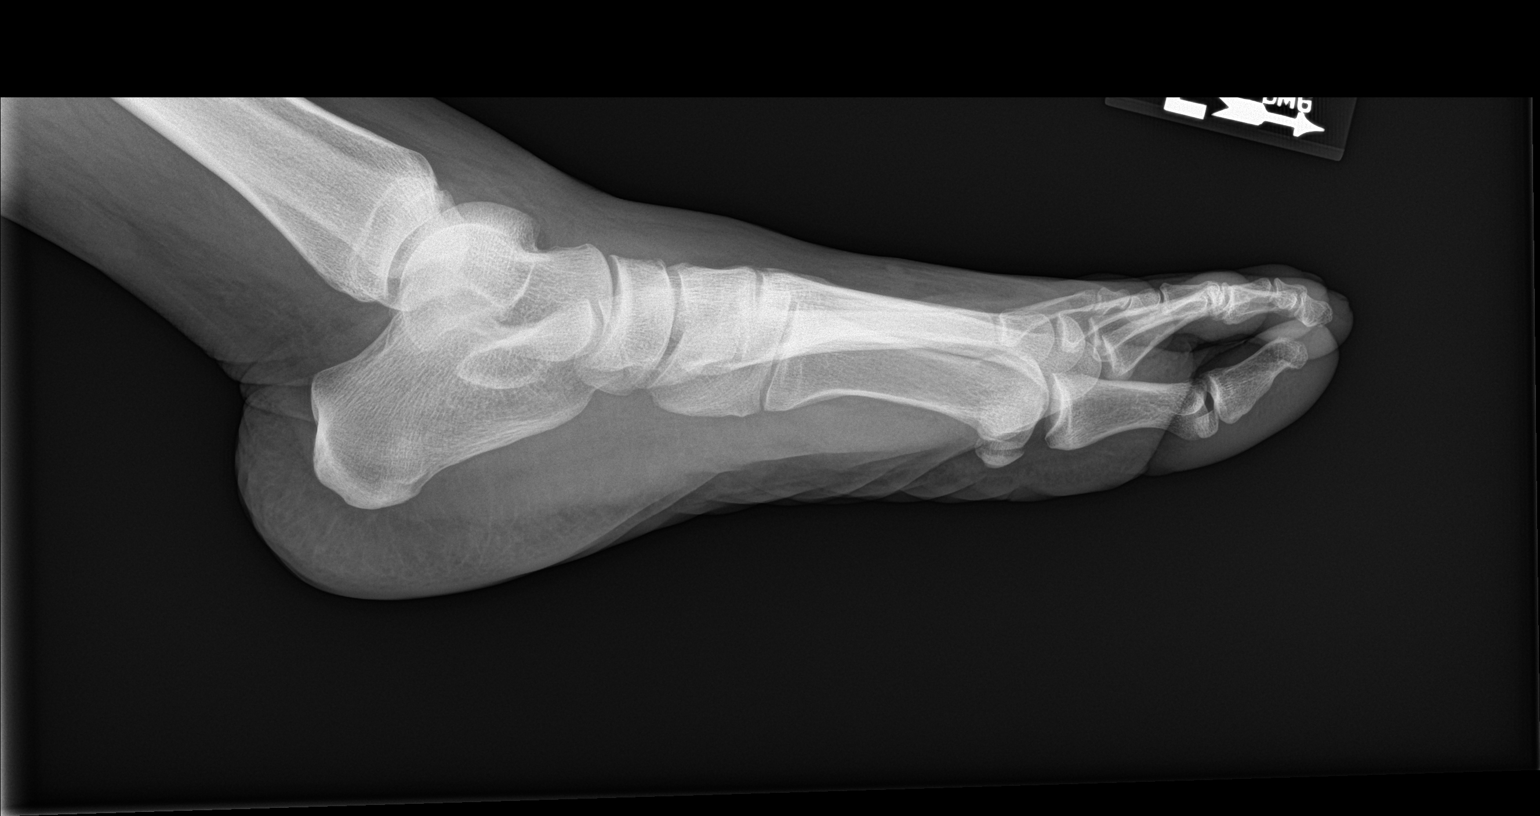

[3 of 3 positions shown; findings below may reference images not displayed]

FINDINGS: Degenerative changes first MTP joint . No acute bony or joint
abnormality identified. No evidence fracture or dislocation.
IMPRESSION: 1. Degenerative changes first MTP joint.

2. No acute abnormality .

## 2018-09-15 DIAGNOSIS — R6883 Chills (without fever): Secondary | ICD-10-CM | POA: Diagnosis not present

## 2018-09-15 DIAGNOSIS — J101 Influenza due to other identified influenza virus with other respiratory manifestations: Secondary | ICD-10-CM | POA: Diagnosis not present

## 2018-09-15 DIAGNOSIS — R05 Cough: Secondary | ICD-10-CM | POA: Diagnosis not present

## 2018-09-15 DIAGNOSIS — R6889 Other general symptoms and signs: Secondary | ICD-10-CM | POA: Diagnosis not present

## 2018-11-25 NOTE — Progress Notes (Signed)
Office Visit Note  Patient: Alicia Garcia             Date of Birth: [birth year]           MRN: [id number]             PCP: Shirlean Mylar, MD Referring: Shirlean Mylar, MD Visit Date: 12/09/2018 Occupation: @  Subjective:  Follow-up visit.   History of Present Illness: Alicia Garcia is a 58 y.o. female with history of systemic lupus dermatosis and osteoarthritis.  She states she continues to have some stiffness off and on in her knee joints but not much discomfort.  She denies any history of oral ulcers, nasal ulcers, malar rash, raynauds phenomenon, lymphadenopathy or arthritis.  She states she tries to avoid sun but cannot use sunscreen as she is sensitive to it.  Activities of Daily Living:  Patient reports morning stiffness for 0 minute.   Patient Denies nocturnal pain.  Difficulty dressing/grooming: Denies Difficulty climbing stairs: Denies Difficulty getting out of chair: Denies Difficulty using hands for taps, buttons, cutlery, and/or writing: Denies  Review of Systems  Constitutional: Negative for fatigue, night sweats, weight gain and weight loss.  HENT: Negative for mouth sores, trouble swallowing, trouble swallowing, mouth dryness and nose dryness.   Eyes: Negative for pain, redness, visual disturbance and dryness.  Respiratory: Negative for cough, shortness of breath and difficulty breathing.   Cardiovascular: Negative for chest pain, palpitations, hypertension, irregular heartbeat and swelling in legs/feet.  Gastrointestinal: Negative for blood in stool, constipation and diarrhea.  Endocrine: Negative for increased urination.  Genitourinary: Negative for vaginal dryness.  Musculoskeletal: Negative for arthralgias, joint pain, joint swelling, myalgias, muscle weakness, morning stiffness, muscle tenderness and myalgias.  Skin: Negative for color change, rash, hair loss, skin tightness, ulcers and sensitivity to sunlight.  Allergic/Immunologic: Negative for  susceptible to infections.  Neurological: Negative for dizziness, memory loss, night sweats and weakness.  Hematological: Negative for swollen glands.  Psychiatric/Behavioral: Negative for depressed mood and sleep disturbance. The patient is not nervous/anxious.     PMFS History:  Patient Active Problem List   Diagnosis Date Noted  . 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 Thalassemia 08/13/2016  . Vitamin D deficiency 06/24/2016  . Systemic lupus erythematosus (HCC) 06/24/2016  . Primary osteoarthritis of both knees 06/24/2016    Past Medical History:  Diagnosis Date  . Anemia   . Heart murmur    at birth, it closed up  . Lupus - trait only   . Thalassemia trait    causes her anemia    Family History  Problem Relation Age of Onset  . Heart attack Mother 28  . Heart attack Father 5  . Hypertension Sister        HBP onset @ 55  . Other Brother 24       gunshot  . Stroke Maternal Grandmother 40       decsd @ 91  . Heart disease Brother        23 years younger, OHS at age 38  . Healthy Daughter   . Healthy Daughter    Past Surgical History:  Procedure Laterality Date  . COLONOSCOPY     2012, normal   . TONSILLECTOMY    . TUBAL LIGATION     Social History   Social History Narrative   Lives in [place] with husband and cat.   Caffeine use: none   Right handed     There is no immunization history on  file for this patient.   Objective: Vital Signs: BP 113/73 (BP Location: Left Arm, Patient Position: Sitting, Cuff Size: Normal)   Pulse (!) 58   Resp 13   Ht 5' 5.75" (1.67 m)   Wt 166 lb (75.3 kg)   BMI 27.00 kg/m    Physical Exam Vitals signs and nursing note reviewed.  Constitutional:      Appearance: She is well-developed.  HENT:     Head: Normocephalic and atraumatic.  Eyes:     Conjunctiva/sclera: Conjunctivae normal.  Neck:     Musculoskeletal: Normal range of motion.  Cardiovascular:     Rate and Rhythm: Normal rate and regular rhythm.      Heart sounds: Normal heart sounds.  Pulmonary:     Effort: Pulmonary effort is normal.     Breath sounds: Normal breath sounds.  Abdominal:     General: Bowel sounds are normal.     Palpations: Abdomen is soft.  Lymphadenopathy:     Cervical: No cervical adenopathy.  Skin:    General: Skin is warm and dry.     Capillary Refill: Capillary refill takes less than 2 seconds.  Neurological:     Mental Status: She is alert and oriented to person, place, and time.  Psychiatric:        Behavior: Behavior normal.      Musculoskeletal Exam: C-spine thoracic and lumbar spine good range of motion.  Shoulder joints, elbow joints, wrist joints, MCPs PIPs and DIPs been good range of motion with no synovitis.  Hip joints knee joints ankles MTPs PIPs been good range of motion with no synovitis.   CDAI Exam: CDAI Score: Not documented Patient Global Assessment: Not documented; Provider Global Assessment: Not documented Swollen: Not documented; Tender: Not documented Joint Exam   Not documented   There is currently no information documented on the homunculus. Go to the Rheumatology activity and complete the homunculus joint exam.  Investigation: No additional findings.  Imaging: No results found.  Recent Labs: Lab Results  Component Value Date   WBC 5.5 12/10/2017   HGB 11.9 (L) 02/01/2018   PLT 261 12/10/2017   NA 141 02/01/2018   K 4.3 02/01/2018   CL 104 02/01/2018   CO2 27 12/10/2017   GLUCOSE 88 02/01/2018   BUN 9 02/01/2018   CREATININE 0.70 02/01/2018   BILITOT 0.7 12/10/2017   ALKPHOS 81 12/09/2016   AST 16 12/10/2017   ALT 10 12/10/2017   PROT 7.9 12/10/2017   ALBUMIN 4.1 12/09/2016   CALCIUM 9.6 12/10/2017   GFRAA 114 12/10/2017    Speciality Comments: No specialty comments available.  Procedures:  No procedures performed Allergies: Patient has no known allergies.   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  She has been off medications for long time.  Her labs have been normal for the last few years.  We will check labs again today.  Plan: CBC with Differential/Platelet, COMPLETE METABOLIC PANEL WITH GFR, Urinalysis, Routine w reflex microscopic, ANA, Anti-DNA antibody, double-stranded, C3 and C4, Sedimentation rate, Cardiolipin antibodies, IgG, IgM, IgA  Anticardiolipin antibody positive - Aspirin 81 mg po daily.  Her anticardiolipin antibodies have been negative.  Primary osteoarthritis of both knees-she continues to have some stiffness in her knee joints but no warmth swelling or effusion was noted.  Vitamin D deficiency -patient states that she  took vitamin D after her last labs but did not recheck her labs.  She has not been taking vitamin D supplement.  Plan: VITAMIN D 25 Hydroxy (Vit-D Deficiency, Fractures)  Thalassemia, unspecified type   Orders: Orders Placed This Encounter  Procedures  . CBC with Differential/Platelet  . COMPLETE METABOLIC PANEL WITH GFR  . Urinalysis, Routine w reflex microscopic  . ANA  . Anti-DNA antibody, double-stranded  . C3 and C4  . Sedimentation rate  . VITAMIN D 25 Hydroxy (Vit-D Deficiency, Fractures)  . Cardiolipin antibodies, IgG, IgM, IgA   No orders of the defined types were placed in this encounter.    Follow-Up Instructions: Return in about 1 year (around 12/09/2019) for Systemic lupus.  She is supposed to notify us in case she develops any new symptoms.   Pollyann Savoy, MD  Note - This record has been created using Animal nutritionist.  Chart creation errors have been sought, but may not always  have been located. Such creation errors do not reflect on  the standard of medical care.

## 2018-12-09 ENCOUNTER — Ambulatory Visit: Payer: BLUE CROSS/BLUE SHIELD | Admitting: Rheumatology

## 2018-12-09 ENCOUNTER — Other Ambulatory Visit: Payer: Self-pay

## 2018-12-09 ENCOUNTER — Encounter: Payer: Self-pay | Admitting: Rheumatology

## 2018-12-09 VITALS — BP 113/73 | HR 58 | Resp 13 | Ht 65.75 in | Wt 166.0 lb

## 2018-12-09 DIAGNOSIS — M3213 Lung involvement in systemic lupus erythematosus: Secondary | ICD-10-CM | POA: Diagnosis not present

## 2018-12-09 DIAGNOSIS — E559 Vitamin D deficiency, unspecified: Secondary | ICD-10-CM

## 2018-12-09 DIAGNOSIS — M170 Bilateral primary osteoarthritis of knee: Secondary | ICD-10-CM

## 2018-12-09 DIAGNOSIS — D569 Thalassemia, unspecified: Secondary | ICD-10-CM

## 2018-12-09 DIAGNOSIS — R760 Raised antibody titer: Secondary | ICD-10-CM | POA: Diagnosis not present

## 2018-12-10 NOTE — Progress Notes (Signed)
Anemia, advise MVI with iron. Vit D is low. Call in Vit D 50,000 U q wk X 3 mths. Recheck in 3 months. After 3 months she should stay on VitD 2000 U OTC qd.

## 2018-12-13 ENCOUNTER — Telehealth: Payer: Self-pay | Admitting: *Deleted

## 2018-12-13 DIAGNOSIS — E559 Vitamin D deficiency, unspecified: Secondary | ICD-10-CM

## 2018-12-13 LAB — CBC WITH DIFFERENTIAL/PLATELET
Absolute Monocytes: 460 cells/uL (ref 200–950)
Basophils Absolute: 47 cells/uL (ref 0–200)
Basophils Relative: 0.8 %
Eosinophils Absolute: 130 cells/uL (ref 15–500)
Eosinophils Relative: 2.2 %
HCT: 36.3 % (ref 35.0–45.0)
Hemoglobin: 10.9 g/dL — ABNORMAL LOW (ref 11.7–15.5)
Lymphs Abs: 2006 cells/uL (ref 850–3900)
MCH: 23.1 pg — ABNORMAL LOW (ref 27.0–33.0)
MCHC: 30 g/dL — ABNORMAL LOW (ref 32.0–36.0)
MCV: 76.9 fL — ABNORMAL LOW (ref 80.0–100.0)
MPV: 11.5 fL (ref 7.5–12.5)
Monocytes Relative: 7.8 %
Neutro Abs: 3257 cells/uL (ref 1500–7800)
Neutrophils Relative %: 55.2 %
Platelets: 264 10*3/uL (ref 140–400)
RBC: 4.72 10*6/uL (ref 3.80–5.10)
RDW: 13.3 % (ref 11.0–15.0)
Total Lymphocyte: 34 %
WBC: 5.9 10*3/uL (ref 3.8–10.8)

## 2018-12-13 LAB — COMPLETE METABOLIC PANEL WITH GFR
AG Ratio: 1.3 (calc) (ref 1.0–2.5)
ALT: 11 U/L (ref 6–29)
AST: 18 U/L (ref 10–35)
Albumin: 4.4 g/dL (ref 3.6–5.1)
Alkaline phosphatase (APISO): 85 U/L (ref 37–153)
BUN: 17 mg/dL (ref 7–25)
CO2: 24 mmol/L (ref 20–32)
Calcium: 9.7 mg/dL (ref 8.6–10.4)
Chloride: 103 mmol/L (ref 98–110)
Creat: 0.73 mg/dL (ref 0.50–1.05)
GFR, Est African American: 105 mL/min/{1.73_m2} (ref >=60)
GFR, Est Non African American: 91 mL/min/{1.73_m2} (ref >=60)
Globulin: 3.3 g/dL (calc) (ref 1.9–3.7)
Glucose, Bld: 85 mg/dL (ref 65–99)
Potassium: 4.4 mmol/L (ref 3.5–5.3)
Sodium: 137 mmol/L (ref 135–146)
Total Bilirubin: 0.7 mg/dL (ref 0.2–1.2)
Total Protein: 7.7 g/dL (ref 6.1–8.1)

## 2018-12-13 LAB — CARDIOLIPIN ANTIBODIES, IGG, IGM, IGA
Anticardiolipin IgA: <11 [APL'U]
Anticardiolipin IgG: <14 [GPL'U]
Anticardiolipin IgM: 21 [MPL'U] — ABNORMAL HIGH

## 2018-12-13 LAB — URINALYSIS, ROUTINE W REFLEX MICROSCOPIC
Bilirubin Urine: NEGATIVE
Glucose, UA: NEGATIVE
Hgb urine dipstick: NEGATIVE
Ketones, ur: NEGATIVE
Leukocytes,Ua: NEGATIVE
Nitrite: NEGATIVE
Protein, ur: NEGATIVE
Specific Gravity, Urine: 1.021 (ref 1.001–1.03)
pH: <=5 (ref 5.0–8.0)

## 2018-12-13 LAB — C3 AND C4
C3 Complement: 112 mg/dL (ref 83–193)
C4 Complement: 25 mg/dL (ref 15–57)

## 2018-12-13 LAB — ANTI-NUCLEAR AB-TITER (ANA TITER): ANA Titer 1: 1:80 {titer} — ABNORMAL HIGH

## 2018-12-13 LAB — ANA: Anti Nuclear Antibody (ANA): POSITIVE — AB

## 2018-12-13 LAB — SEDIMENTATION RATE: Sed Rate: 38 mm/h — ABNORMAL HIGH (ref 0–30)

## 2018-12-13 LAB — VITAMIN D 25 HYDROXY (VIT D DEFICIENCY, FRACTURES): Vit D, 25-Hydroxy: 23 ng/mL — ABNORMAL LOW (ref 30–100)

## 2018-12-13 LAB — ANTI-DNA ANTIBODY, DOUBLE-STRANDED: ds DNA Ab: 2 IU/mL

## 2018-12-13 MED ORDER — VITAMIN D (ERGOCALCIFEROL) 1.25 MG (50000 UNIT) PO CAPS: 50000.0000 [IU] | ORAL_CAPSULE | ORAL | 0 refills | Status: DC

## 2018-12-13 NOTE — Telephone Encounter (Signed)
-----   Message from Gearldine Bienenstock, PA-C sent at 12/13/2018  8:13 AM EDT ----- UA WNL.  CMP WNL.  Complements WNL.  dsDNA is 2.  Sed rate elevated but trending down. Anticardiolipin IgM low to medium positive.

## 2019-09-15 DIAGNOSIS — S76312A Strain of muscle, fascia and tendon of the posterior muscle group at thigh level, left thigh, initial encounter: Secondary | ICD-10-CM | POA: Diagnosis not present

## 2019-09-30 DIAGNOSIS — M79605 Pain in left leg: Secondary | ICD-10-CM | POA: Diagnosis not present

## 2019-09-30 DIAGNOSIS — M791 Myalgia, unspecified site: Secondary | ICD-10-CM | POA: Diagnosis not present

## 2019-09-30 DIAGNOSIS — M79604 Pain in right leg: Secondary | ICD-10-CM | POA: Diagnosis not present

## 2019-10-06 DIAGNOSIS — Z23 Encounter for immunization: Secondary | ICD-10-CM | POA: Diagnosis not present

## 2019-10-07 DIAGNOSIS — M79605 Pain in left leg: Secondary | ICD-10-CM | POA: Diagnosis not present

## 2019-10-07 DIAGNOSIS — M791 Myalgia, unspecified site: Secondary | ICD-10-CM | POA: Diagnosis not present

## 2019-10-07 DIAGNOSIS — M79604 Pain in right leg: Secondary | ICD-10-CM | POA: Diagnosis not present

## 2019-10-14 DIAGNOSIS — M791 Myalgia, unspecified site: Secondary | ICD-10-CM | POA: Diagnosis not present

## 2019-10-14 DIAGNOSIS — M79605 Pain in left leg: Secondary | ICD-10-CM | POA: Diagnosis not present

## 2019-10-14 DIAGNOSIS — M79604 Pain in right leg: Secondary | ICD-10-CM | POA: Diagnosis not present

## 2019-10-21 DIAGNOSIS — M79604 Pain in right leg: Secondary | ICD-10-CM | POA: Diagnosis not present

## 2019-10-21 DIAGNOSIS — M79605 Pain in left leg: Secondary | ICD-10-CM | POA: Diagnosis not present

## 2019-10-21 DIAGNOSIS — M791 Myalgia, unspecified site: Secondary | ICD-10-CM | POA: Diagnosis not present

## 2019-10-28 DIAGNOSIS — M791 Myalgia, unspecified site: Secondary | ICD-10-CM | POA: Diagnosis not present

## 2019-10-28 DIAGNOSIS — M79605 Pain in left leg: Secondary | ICD-10-CM | POA: Diagnosis not present

## 2019-10-28 DIAGNOSIS — M79604 Pain in right leg: Secondary | ICD-10-CM | POA: Diagnosis not present

## 2019-10-29 DIAGNOSIS — Z23 Encounter for immunization: Secondary | ICD-10-CM | POA: Diagnosis not present

## 2019-12-07 NOTE — Progress Notes (Deleted)
Office Visit Note  Patient: Alicia Garcia             Date of Birth: 06-17-1961           MRN: [id number]             PCP: Shirlean Mylar, MD Referring: Shirlean Mylar, MD Visit Date: 12/21/2019 Occupation: @GUAROCC @  Subjective:  No chief complaint on file.   History of Present Illness: RABECCA Garcia is a 59 y.o. female ***   Activities of Daily Living:  Patient reports morning stiffness for *** {minute/hour:19697}.   Patient {ACTIONS;DENIES/REPORTS:21021675::"Denies"} nocturnal pain.  Difficulty dressing/grooming: {ACTIONS;DENIES/REPORTS:21021675::"Denies"} Difficulty climbing stairs: {ACTIONS;DENIES/REPORTS:21021675::"Denies"} Difficulty getting out of chair: {ACTIONS;DENIES/REPORTS:21021675::"Denies"} Difficulty using hands for taps, buttons, cutlery, and/or writing: {ACTIONS;DENIES/REPORTS:21021675::"Denies"}  No Rheumatology ROS completed.   PMFS History:  Patient Active Problem List   Diagnosis Date Noted  . 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 Thalassemia 08/13/2016  . Vitamin D deficiency 06/24/2016  . Systemic lupus erythematosus (HCC) 06/24/2016  . Primary osteoarthritis of both knees 06/24/2016    Past Medical History:  Diagnosis Date  . Anemia   . Heart murmur    at birth, it closed up  . Lupus - trait only   . Thalassemia trait    causes her anemia    Family History  Problem Relation Age of Onset  . Heart attack Mother 82  . Heart attack Father 74  . Hypertension Sister        HBP onset @ 55  . Other Brother 24       gunshot  . Stroke Maternal Grandmother 40       decsd @ 91  . Heart disease Brother        3 years younger, OHS at age 74  . Healthy Daughter   . Healthy Daughter    Past Surgical History:  Procedure Laterality Date  . COLONOSCOPY     2012, normal   . TONSILLECTOMY    . TUBAL LIGATION     Social History   Social History Narrative   Lives in [place] with husband and cat.   Caffeine use: none   Right handed      There is no immunization history on file for this patient.   Objective: Vital Signs: There were no vitals taken for this visit.   Physical Exam   Musculoskeletal Exam: ***  CDAI Exam: CDAI Score: -- Patient Global: --; Provider Global: -- Swollen: --; Tender: -- Joint Exam 12/21/2019   No joint exam has been documented for this visit   There is currently no information documented on the homunculus. Go to the Rheumatology activity and complete the homunculus joint exam.  Investigation: No additional findings.  Imaging: No results found.  Recent Labs: Lab Results  Component Value Date   WBC 5.9 12/09/2018   HGB 10.9 (L) 12/09/2018   PLT 264 12/09/2018   NA 137 12/09/2018   K 4.4 12/09/2018   CL 103 12/09/2018   CO2 24 12/09/2018   GLUCOSE 85 12/09/2018   BUN 17 12/09/2018   CREATININE 0.73 12/09/2018   BILITOT 0.7 12/09/2018   ALKPHOS 81 12/09/2016   AST 18 12/09/2018   ALT 11 12/09/2018   PROT 7.7 12/09/2018   ALBUMIN 4.1 12/09/2016   CALCIUM 9.7 12/09/2018   GFRAA 105 12/09/2018    Speciality Comments: No specialty comments available.  Procedures:  No procedures performed Allergies: Patient has no known allergies.   Assessment / Plan:  Visit Diagnoses: No diagnosis found.  Orders: No orders of the defined types were placed in this encounter.  No orders of the defined types were placed in this encounter.   Face-to-face time spent with patient was *** minutes. Greater than 50% of time was spent in counseling and coordination of care.  Follow-Up Instructions: No follow-ups on file.   Earnestine Mealing, CMA  Note - This record has been created using Editor, commissioning.  Chart creation errors have been sought, but may not always  have been located. Such creation errors do not reflect on  the standard of medical care.

## 2019-12-08 ENCOUNTER — Ambulatory Visit: Payer: BLUE CROSS/BLUE SHIELD | Admitting: Rheumatology

## 2019-12-21 ENCOUNTER — Ambulatory Visit: Payer: BLUE CROSS/BLUE SHIELD | Admitting: Rheumatology

## 2019-12-21 DIAGNOSIS — Z13220 Encounter for screening for lipoid disorders: Secondary | ICD-10-CM | POA: Diagnosis not present

## 2019-12-21 DIAGNOSIS — Z0000 Encounter for general adult medical examination without abnormal findings: Secondary | ICD-10-CM | POA: Diagnosis not present

## 2019-12-21 DIAGNOSIS — Z5181 Encounter for therapeutic drug level monitoring: Secondary | ICD-10-CM | POA: Diagnosis not present

## 2020-01-20 DIAGNOSIS — Z1231 Encounter for screening mammogram for malignant neoplasm of breast: Secondary | ICD-10-CM | POA: Diagnosis not present

## 2020-01-27 NOTE — Progress Notes (Deleted)
Office Visit Note  Patient: Alicia Garcia             Date of Birth: [birth year]           MRN: [id number]             PCP: Shirlean Mylar, MD Referring: Shirlean Mylar, MD Visit Date: 02/09/2020 Occupation: @GUAROCC @  Subjective:  No chief complaint on file.   History of Present Illness: Alicia Garcia is a 59 y.o. female ***   Activities of Daily Living:  Patient reports morning stiffness for *** {minute/hour:19697}.   Patient {ACTIONS;DENIES/REPORTS:21021675::"Denies"} nocturnal pain.  Difficulty dressing/grooming: {ACTIONS;DENIES/REPORTS:21021675::"Denies"} Difficulty climbing stairs: {ACTIONS;DENIES/REPORTS:21021675::"Denies"} Difficulty getting out of chair: {ACTIONS;DENIES/REPORTS:21021675::"Denies"} Difficulty using hands for taps, buttons, cutlery, and/or writing: {ACTIONS;DENIES/REPORTS:21021675::"Denies"}  No Rheumatology ROS completed.   PMFS History:  Patient Active Problem List   Diagnosis Date Noted  . 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 Thalassemia 08/13/2016  . Vitamin D deficiency 06/24/2016  . Systemic lupus erythematosus (HCC) 06/24/2016  . Primary osteoarthritis of both knees 06/24/2016    Past Medical History:  Diagnosis Date  . Anemia   . Heart murmur    at birth, it closed up  . Lupus - trait only   . Thalassemia trait    causes her anemia    Family History  Problem Relation Age of Onset  . Heart attack Mother 38  . Heart attack Father 91  . Hypertension Sister        HBP onset @ 55  . Other Brother 24       gunshot  . Stroke Maternal Grandmother 40       decsd @ 91  . Heart disease Brother        4 years younger, OHS at age 67  . Healthy Daughter   . Healthy Daughter    Past Surgical History:  Procedure Laterality Date  . COLONOSCOPY     2012, normal   . TONSILLECTOMY    . TUBAL LIGATION     Social History   Social History Narrative   Lives in [place] with husband and cat.   Caffeine use: none   Right handed      There is no immunization history on file for this patient.   Objective: Vital Signs: There were no vitals taken for this visit.   Physical Exam   Musculoskeletal Exam: ***  CDAI Exam: CDAI Score: -- Patient Global: --; Provider Global: -- Swollen: --; Tender: -- Joint Exam 02/09/2020   No joint exam has been documented for this visit   There is currently no information documented on the homunculus. Go to the Rheumatology activity and complete the homunculus joint exam.  Investigation: No additional findings.  Imaging: No results found.  Recent Labs: Lab Results  Component Value Date   WBC 5.9 12/09/2018   HGB 10.9 (L) 12/09/2018   PLT 264 12/09/2018   NA 137 12/09/2018   K 4.4 12/09/2018   CL 103 12/09/2018   CO2 24 12/09/2018   GLUCOSE 85 12/09/2018   BUN 17 12/09/2018   CREATININE 0.73 12/09/2018   BILITOT 0.7 12/09/2018   ALKPHOS 81 12/09/2016   AST 18 12/09/2018   ALT 11 12/09/2018   PROT 7.7 12/09/2018   ALBUMIN 4.1 12/09/2016   CALCIUM 9.7 12/09/2018   GFRAA 105 12/09/2018    Speciality Comments: No specialty comments available.  Procedures:  No procedures performed Allergies: Patient has no known allergies.   Assessment / Plan:  Visit Diagnoses: No diagnosis found.  Orders: No orders of the defined types were placed in this encounter.  No orders of the defined types were placed in this encounter.   Face-to-face time spent with patient was *** minutes. Greater than 50% of time was spent in counseling and coordination of care.  Follow-Up Instructions: No follow-ups on file.   Ofilia Neas, PA-C  Note - This record has been created using Dragon software.  Chart creation errors have been sought, but may not always  have been located. Such creation errors do not reflect on  the standard of medical care.

## 2020-02-09 ENCOUNTER — Ambulatory Visit: Payer: BLUE CROSS/BLUE SHIELD | Admitting: Physician Assistant

## 2020-02-10 ENCOUNTER — Ambulatory Visit: Payer: BLUE CROSS/BLUE SHIELD | Admitting: Rheumatology

## 2020-02-14 ENCOUNTER — Encounter: Payer: Self-pay | Admitting: Rheumatology

## 2020-02-14 ENCOUNTER — Other Ambulatory Visit: Payer: Self-pay

## 2020-02-14 ENCOUNTER — Ambulatory Visit: Payer: BC Managed Care – PPO | Admitting: Rheumatology

## 2020-02-14 VITALS — BP 112/69 | HR 47 | Resp 15 | Ht 66.0 in | Wt 152.6 lb

## 2020-02-14 DIAGNOSIS — Z780 Asymptomatic menopausal state: Secondary | ICD-10-CM

## 2020-02-14 DIAGNOSIS — D569 Thalassemia, unspecified: Secondary | ICD-10-CM

## 2020-02-14 DIAGNOSIS — E559 Vitamin D deficiency, unspecified: Secondary | ICD-10-CM

## 2020-02-14 DIAGNOSIS — R760 Raised antibody titer: Secondary | ICD-10-CM

## 2020-02-14 DIAGNOSIS — M170 Bilateral primary osteoarthritis of knee: Secondary | ICD-10-CM | POA: Diagnosis not present

## 2020-02-14 DIAGNOSIS — M3213 Lung involvement in systemic lupus erythematosus: Secondary | ICD-10-CM

## 2020-02-14 NOTE — Progress Notes (Signed)
Office Visit Note  Patient: Alicia Garcia             Date of Birth: March 02, 1961           MRN: [id number]             PCP: Shirlean Mylar, MD Referring: Shirlean Mylar, MD Visit Date: 02/14/2020 Occupation: @GUAROCC @  Subjective:  Disease monitoring.   History of Present Illness: Alicia Garcia is a 59 y.o. female with history of systemic lupus rhythmic ptosis and osteoarthritis.  She states she has been doing well without any concerns.  She denies any history of oral ulcers, nasal ulcers, malar rash, photosensitivity, sicca symptoms, Raynaud's phenomenon or lymphadenopathy.  She denies any joint pain or joint swelling.  She has occasional discomfort in her knee joints.  Activities of Daily Living:  Patient reports morning stiffness for 0 minutes.   Patient Denies nocturnal pain.  Difficulty dressing/grooming: Denies Difficulty climbing stairs: Denies Difficulty getting out of chair: Denies Difficulty using hands for taps, buttons, cutlery, and/or writing: Denies  Review of Systems  Constitutional: Negative for fatigue, night sweats, weight gain and weight loss.  HENT: Negative for mouth sores, trouble swallowing, trouble swallowing, mouth dryness and nose dryness.   Eyes: Negative for pain, redness, itching, visual disturbance and dryness.  Respiratory: Negative for cough, shortness of breath and difficulty breathing.   Cardiovascular: Negative for chest pain, palpitations, hypertension, irregular heartbeat and swelling in legs/feet.  Gastrointestinal: Negative for blood in stool, constipation and diarrhea.  Endocrine: Negative for increased urination.  Genitourinary: Negative for difficulty urinating and vaginal dryness.  Musculoskeletal: Negative for arthralgias, joint pain, joint swelling, myalgias, muscle weakness, morning stiffness, muscle tenderness and myalgias.  Skin: Negative for color change, rash, hair loss, redness, skin tightness, ulcers and sensitivity to sunlight.    Allergic/Immunologic: Negative for susceptible to infections.  Neurological: Negative for dizziness, numbness, headaches, memory loss, night sweats and weakness.  Hematological: Negative for bruising/bleeding tendency and swollen glands.  Psychiatric/Behavioral: Negative for depressed mood, confusion and sleep disturbance. The patient is not nervous/anxious.     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   Thalassemia 08/13/2016   Vitamin D deficiency 06/24/2016   Systemic lupus erythematosus (HCC) 06/24/2016   Primary osteoarthritis of both knees 06/24/2016    Past Medical History:  Diagnosis Date   Anemia    Heart murmur    at birth, it closed up   Lupus - trait only    Thalassemia trait    causes her anemia    Family History  Problem Relation Age of Onset   Heart attack Mother 27   Heart attack Father 58   Hypertension Sister        HBP onset @ 46   Other Brother 24       gunshot   Stroke Maternal Grandmother 40       decsd @ 62   Heart disease Brother        13 years younger, OHS at age 28   Healthy Daughter    Healthy Daughter    Past Surgical History:  Procedure Laterality Date   COLONOSCOPY     2012, normal    TONSILLECTOMY     TUBAL LIGATION     Social History   Social History Narrative   Lives in [place] with husband and cat.   Caffeine use: none   Right handed  There is no immunization history on file for this patient.   Objective: Vital Signs: BP 112/69 (BP Location: Left Arm, Patient Position: Sitting, Cuff Size: Normal)    Pulse (!) 47    Resp 15    Ht 5\' 6"  (1.676 m)    Wt 152 lb 9.6 oz (69.2 kg)    BMI 24.63 kg/m    Physical Exam Vitals and nursing note reviewed.  Constitutional:      Appearance: She is well-developed.  HENT:     Head: Normocephalic and atraumatic.  Eyes:     Conjunctiva/sclera: Conjunctivae normal.  Cardiovascular:     Rate  and Rhythm: Normal rate and regular rhythm.     Heart sounds: Normal heart sounds.  Pulmonary:     Effort: Pulmonary effort is normal.     Breath sounds: Normal breath sounds.  Abdominal:     General: Bowel sounds are normal.     Palpations: Abdomen is soft.  Musculoskeletal:     Cervical back: Normal range of motion.  Lymphadenopathy:     Cervical: No cervical adenopathy.  Skin:    General: Skin is warm and dry.     Capillary Refill: Capillary refill takes less than 2 seconds.  Neurological:     Mental Status: She is alert and oriented to person, place, and time.  Psychiatric:        Behavior: Behavior normal.      Musculoskeletal Exam: C-spine, thoracic and lumbar spine were in good range of motion.  Shoulder joints, elbow joints, wrist joints, MCPs PIPs and DIPs with good range of motion with no synovitis.  Hip joints, knee joints, ankles, MTPs and PIPs with good range of motion with no synovitis.  CDAI Exam: CDAI Score: -- Patient Global: --; Provider Global: -- Swollen: --; Tender: -- Joint Exam 02/14/2020   No joint exam has been documented for this visit   There is currently no information documented on the homunculus. Go to the Rheumatology activity and complete the homunculus joint exam.  Investigation: No additional findings.  Imaging: No results found.  Recent Labs: Lab Results  Component Value Date   WBC 5.9 12/09/2018   HGB 10.9 (L) 12/09/2018   PLT 264 12/09/2018   NA 137 12/09/2018   K 4.4 12/09/2018   CL 103 12/09/2018   CO2 24 12/09/2018   GLUCOSE 85 12/09/2018   BUN 17 12/09/2018   CREATININE 0.73 12/09/2018   BILITOT 0.7 12/09/2018   ALKPHOS 81 12/09/2016   AST 18 12/09/2018   ALT 11 12/09/2018   PROT 7.7 12/09/2018   ALBUMIN 4.1 12/09/2016   CALCIUM 9.7 12/09/2018   GFRAA 105 12/09/2018    Speciality Comments: No specialty comments available.  Procedures:  No procedures performed Allergies: Patient has no known allergies.    Assessment / Plan:     Visit Diagnoses: Other systemic lupus erythematosus with lung involvement (HCC) - +ANA,+dsDNA,lowC3,+aCL IgG, h/o pleuritis -she is clinically doing well with no flares of her symptoms.  She has been off treatment for many years now.  I will check autoimmune antibodies today.  Plan: CBC with Differential/Platelet, COMPLETE METABOLIC PANEL WITH GFR, Urinalysis, Routine w reflex microscopic, ANA, Anti-DNA antibody, double-stranded, C3 and C4, Sedimentation rate, Cardiolipin antibodies, IgG, IgM, IgA  Anticardiolipin antibody positive-she was taking aspirin which she stopped.  She had only anticardiolipin IgM positive in the past.  We will check again.  If anticardiolipin IgG is significantly positive then I would recommend aspirin.  Primary osteoarthritis  of both knees-she has been exercising and has not had much discomfort in her knee joints.  Thalassemia, unspecified type  Vitamin D deficiency - Plan: VITAMIN D 25 Hydroxy (Vit-D Deficiency, Fractures) she has been taking vitamin D over-the-counter.  Postmenopausal-osteoporosis screening.  Patient states she has never had a DEXA scan.  We will schedule DEXA scan.  Orders: Orders Placed This Encounter  Procedures   DG BONE DENSITY (DXA)   CBC with Differential/Platelet   COMPLETE METABOLIC PANEL WITH GFR   Urinalysis, Routine w reflex microscopic   ANA   Anti-DNA antibody, double-stranded   C3 and C4   Sedimentation rate   VITAMIN D 25 Hydroxy (Vit-D Deficiency, Fractures)   Cardiolipin antibodies, IgG, IgM, IgA   No orders of the defined types were placed in this encounter.  She is fully vaccinated against COVID-19.  I have advised her to continue wear a mask, practice social distancing and hand hygiene.   Follow-Up Instructions: Return in about 1 year (around 02/13/2021) for Systemic lupus.   Alicia Savoy, MD  Note - This record has been created using Animal nutritionist.  Chart creation errors  have been sought, but may not always  have been located. Such creation errors do not reflect on  the standard of medical care.

## 2020-02-14 NOTE — Patient Instructions (Addendum)
   COVID-19 vaccine recommendations:   COVID-19 vaccine is recommended for everyone (unless you are allergic to a vaccine component), even if you are on a medication that suppresses your immune system.   If you are on Methotrexate, Cellcept (mycophenolate), Rinvoq, Xeljanz, and Olumiant- hold the medication for 1 week after each vaccine. Hold Methotrexate for 2 weeks after the single dose COVID-19 vaccine.   If you are on Orencia subcutaneous injection - hold medication one week prior to and one week after the first COVID-19 vaccine dose (only).   If you are on Orencia IV infusions- time vaccination administration so that the first COVID-19 vaccination will occur four weeks after the infusion and postpone the subsequent infusion by one week.   If you are on Cyclophosphamide or Rituxan infusions please contact your doctor prior to receiving the COVID-19 vaccine.   Do not take Tylenol or ant anti-inflammatory medications (NSAIDs) 24 hours prior to the COVID-19 vaccination.   There is no direct evidence about the efficacy of the COVID-19 vaccine in individuals who are on medications that suppress the immune system.   Even if you are fully vaccinated, and you are on any medications that suppress your immune system, please continue to wear a mask, maintain at least six feet social distance and practice hand hygiene.   If you develop a COVID-19 infection, please contact your PCP or our office to determine if you need antibody infusion.  We anticipate that a booster vaccine will be available soon for immunosuppressed individuals. Please cal our office before receiving your booster dose to make adjustments to your medication regimen.  

## 2020-02-15 ENCOUNTER — Other Ambulatory Visit: Payer: Self-pay | Admitting: *Deleted

## 2020-02-15 DIAGNOSIS — E559 Vitamin D deficiency, unspecified: Secondary | ICD-10-CM

## 2020-02-15 MED ORDER — VITAMIN D (ERGOCALCIFEROL) 1.25 MG (50000 UNIT) PO CAPS: 50000.0000 [IU] | ORAL_CAPSULE | ORAL | 0 refills | Status: DC

## 2020-02-15 NOTE — Telephone Encounter (Signed)
-----   Message from Gearldine Bienenstock, PA-C sent at 02/15/2020  7:53 AM EDT ----- Vitamin D is low-25. Please notify the patient and send in vitamin D 50,000 units by mouth once weekly x3 months.  Recheck vitamin D in 3 months.

## 2020-02-18 LAB — CBC WITH DIFFERENTIAL/PLATELET
Absolute Monocytes: 437 cells/uL (ref 200–950)
Basophils Absolute: 50 cells/uL (ref 0–200)
Basophils Relative: 1.1 %
Eosinophils Absolute: 68 cells/uL (ref 15–500)
Eosinophils Relative: 1.5 %
HCT: 36.2 % (ref 35.0–45.0)
Hemoglobin: 10.8 g/dL — ABNORMAL LOW (ref 11.7–15.5)
Lymphs Abs: 1751 cells/uL (ref 850–3900)
MCH: 23 pg — ABNORMAL LOW (ref 27.0–33.0)
MCHC: 29.8 g/dL — ABNORMAL LOW (ref 32.0–36.0)
MCV: 77.2 fL — ABNORMAL LOW (ref 80.0–100.0)
MPV: 11.2 fL (ref 7.5–12.5)
Monocytes Relative: 9.7 %
Neutro Abs: 2196 cells/uL (ref 1500–7800)
Neutrophils Relative %: 48.8 %
Platelets: 244 10*3/uL (ref 140–400)
RBC: 4.69 10*6/uL (ref 3.80–5.10)
RDW: 12.9 % (ref 11.0–15.0)
Total Lymphocyte: 38.9 %
WBC: 4.5 10*3/uL (ref 3.8–10.8)

## 2020-02-18 LAB — ANTI-NUCLEAR AB-TITER (ANA TITER)
ANA TITER: 1:80 {titer} — ABNORMAL HIGH
ANA Titer 1: 1:40 {titer} — ABNORMAL HIGH

## 2020-02-18 LAB — URINALYSIS, ROUTINE W REFLEX MICROSCOPIC
Bacteria, UA: NONE SEEN /HPF
Bilirubin Urine: NEGATIVE
Glucose, UA: NEGATIVE
Hyaline Cast: NONE SEEN /LPF
Ketones, ur: NEGATIVE
Leukocytes,Ua: NEGATIVE
Nitrite: NEGATIVE
Protein, ur: NEGATIVE
Specific Gravity, Urine: 1.028 (ref 1.001–1.03)
pH: 6 (ref 5.0–8.0)

## 2020-02-18 LAB — C3 AND C4
C3 Complement: 106 mg/dL (ref 83–193)
C4 Complement: 21 mg/dL (ref 15–57)

## 2020-02-18 LAB — CARDIOLIPIN ANTIBODIES, IGG, IGM, IGA
Anticardiolipin IgA: <2 APL-U/mL
Anticardiolipin IgG: 31.1 GPL-U/mL — ABNORMAL HIGH
Anticardiolipin IgM: <2 MPL-U/mL

## 2020-02-18 LAB — COMPLETE METABOLIC PANEL WITH GFR
AG Ratio: 1.3 (calc) (ref 1.0–2.5)
ALT: 7 U/L (ref 6–29)
AST: 16 U/L (ref 10–35)
Albumin: 4.3 g/dL (ref 3.6–5.1)
Alkaline phosphatase (APISO): 70 U/L (ref 37–153)
BUN: 15 mg/dL (ref 7–25)
CO2: 27 mmol/L (ref 20–32)
Calcium: 9.3 mg/dL (ref 8.6–10.4)
Chloride: 104 mmol/L (ref 98–110)
Creat: 0.67 mg/dL (ref 0.50–1.05)
GFR, Est African American: 112 mL/min/{1.73_m2} (ref >=60)
GFR, Est Non African American: 96 mL/min/{1.73_m2} (ref >=60)
Globulin: 3.2 g/dL (calc) (ref 1.9–3.7)
Glucose, Bld: 86 mg/dL (ref 65–99)
Potassium: 4 mmol/L (ref 3.5–5.3)
Sodium: 138 mmol/L (ref 135–146)
Total Bilirubin: 0.6 mg/dL (ref 0.2–1.2)
Total Protein: 7.5 g/dL (ref 6.1–8.1)

## 2020-02-18 LAB — ANTI-DNA ANTIBODY, DOUBLE-STRANDED: ds DNA Ab: 1 IU/mL

## 2020-02-18 LAB — ANA: Anti Nuclear Antibody (ANA): POSITIVE — AB

## 2020-02-18 LAB — SEDIMENTATION RATE: Sed Rate: 31 mm/h — ABNORMAL HIGH (ref 0–30)

## 2020-02-18 LAB — VITAMIN D 25 HYDROXY (VIT D DEFICIENCY, FRACTURES): Vit D, 25-Hydroxy: 25 ng/mL — ABNORMAL LOW (ref 30–100)

## 2020-02-20 ENCOUNTER — Telehealth: Payer: Self-pay | Admitting: Rheumatology

## 2020-02-20 NOTE — Telephone Encounter (Signed)
I called patient, DEXA is being performed due to Postmenopausal-osteoporosis screening. Patient will call [facility] to schedule appt.

## 2020-02-20 NOTE — Telephone Encounter (Signed)
Patient requesting a call back to discuss why Dr. Corliss Skains ordered a bone density on her. Also, patient requests that it be coded as preventative so insurance will cover. Please call patient to advise. If patient does not answer, please leave a detailed message.

## 2020-02-20 NOTE — Progress Notes (Signed)
Anemia persists.  CMP is normal, UA is negative.  ANA is low titer positive.  Anticardiolipin antibody is low titer positive.  Sed rate is mildly elevated.  I would recommend taking aspirin 81 mg every other day.

## 2020-02-28 ENCOUNTER — Other Ambulatory Visit: Payer: Self-pay

## 2020-02-28 ENCOUNTER — Ambulatory Visit (HOSPITAL_COMMUNITY)
Admission: RE | Admit: 2020-02-28 | Discharge: 2020-02-28 | Disposition: A | Discharge status: Home / Self Care | Payer: BC Managed Care – PPO | Source: Ambulatory Visit | Attending: Rheumatology | Admitting: Rheumatology

## 2020-02-28 DIAGNOSIS — Z780 Asymptomatic menopausal state: Secondary | ICD-10-CM | POA: Diagnosis not present

## 2020-02-28 DIAGNOSIS — M8589 Other specified disorders of bone density and structure, multiple sites: Secondary | ICD-10-CM | POA: Diagnosis not present

## 2020-02-28 NOTE — Progress Notes (Signed)
DXA is consistent with osteopenia.  Please discuss calcium intake, vitamin D and resistive exercises.  We will plan to repeat DXA scan in 2 years.

## 2021-01-29 NOTE — Progress Notes (Signed)
Office Visit Note  Patient: Alicia Garcia             Date of Birth: [birth year]           MRN: [id number]             PCP: Shirlean Mylar, MD Referring: Shirlean Mylar, MD Visit Date: 02/12/2021 Occupation: @GUAROCC @  Subjective:  Other (Bilateral thumb soreness)   History of Present Illness: KARIN PINEDO is a 60 y.o. female with a history of systemic lupus and osteoarthritis.  She states she has some stiffness in her thumb off and on.  She denies any history of oral ulcers, nasal ulcers, malar rash, photosensitivity, Raynaud's phenomenon or lymphadenopathy.  She denies any joint inflammation.  There is no history of shortness of breath or palpitations.  Activities of Daily Living:  Patient reports morning stiffness for 0 minutes.   Patient Denies nocturnal pain.  Difficulty dressing/grooming: Denies Difficulty climbing stairs: Denies Difficulty getting out of chair: Denies Difficulty using hands for taps, buttons, cutlery, and/or writing: Denies  Review of Systems  Constitutional:  Negative for fatigue.  HENT:  Negative for mouth sores, mouth dryness and nose dryness.   Eyes:  Negative for pain, itching and dryness.  Respiratory:  Negative for shortness of breath and difficulty breathing.   Cardiovascular:  Negative for chest pain and palpitations.  Gastrointestinal:  Negative for blood in stool, constipation and diarrhea.  Endocrine: Negative for increased urination.  Genitourinary:  Negative for difficulty urinating.  Musculoskeletal:  Positive for joint pain and joint pain. Negative for joint swelling, myalgias, morning stiffness, muscle tenderness and myalgias.  Skin:  Negative for color change, rash and redness.  Allergic/Immunologic: Negative for susceptible to infections.  Neurological:  Negative for dizziness, numbness, headaches, memory loss and weakness.  Hematological:  Negative for bruising/bleeding tendency.  Psychiatric/Behavioral:  Negative for confusion.     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   Thalassemia 08/13/2016   Vitamin D deficiency 06/24/2016   Systemic lupus erythematosus (HCC) 06/24/2016   Primary osteoarthritis of both knees 06/24/2016    Past Medical History:  Diagnosis Date   Anemia    Heart murmur    at birth, it closed up   Lupus - trait only    Thalassemia trait    causes her anemia    Family History  Problem Relation Age of Onset   Heart attack Mother 78   Heart attack Father 15   Hypertension Sister        HBP onset @ 54   Other Brother 24       gunshot   Stroke Maternal Grandmother 40       decsd @ 60   Heart disease Brother        13 years younger, OHS at age 18   Healthy Daughter    Healthy Daughter    Past Surgical History:  Procedure Laterality Date   COLONOSCOPY     2012, normal    TONSILLECTOMY     TUBAL LIGATION     Social History   Social History Narrative   Lives in [place] with husband and cat.   Caffeine use: none   Right handed     There is no immunization history on file for this patient.   Objective: Vital Signs: BP 111/70 (BP Location: Left Arm, Patient Position: Sitting, Cuff Size: Normal)   Pulse (!) 44   Ht  5\' 6"  (1.676 m)   Wt 151 lb 6.4 oz (68.7 kg)   LMP 06/02/2016 (Approximate)   BMI 24.44 kg/m    Physical Exam Vitals and nursing note reviewed.  Constitutional:      Appearance: She is well-developed.  HENT:     Head: Normocephalic and atraumatic.  Eyes:     Conjunctiva/sclera: Conjunctivae normal.  Cardiovascular:     Rate and Rhythm: Normal rate and regular rhythm.     Heart sounds: Normal heart sounds.  Pulmonary:     Effort: Pulmonary effort is normal.     Breath sounds: Normal breath sounds.  Abdominal:     General: Bowel sounds are normal.     Palpations: Abdomen is soft.  Musculoskeletal:     Cervical back: Normal range of motion.  Lymphadenopathy:     Cervical: No  cervical adenopathy.  Skin:    General: Skin is warm and dry.     Capillary Refill: Capillary refill takes less than 2 seconds.  Neurological:     Mental Status: She is alert and oriented to person, place, and time.  Psychiatric:        Behavior: Behavior normal.     Musculoskeletal Exam: C-spine thoracic and lumbar spine were in good range of motion.  Shoulder joints, elbow joints, wrist joints, MCPs PIPs and DIPs with good range of motion with no synovitis.  Hip joints, knee joints, ankles, MTPs and PIPs with good range of motion with no synovitis.  CDAI Exam: CDAI Score: -- Patient Global: --; Provider Global: -- Swollen: --; Tender: -- Joint Exam 02/12/2021   No joint exam has been documented for this visit   There is currently no information documented on the homunculus. Go to the Rheumatology activity and complete the homunculus joint exam.  Investigation: No additional findings.  Imaging: No results found.  Recent Labs: Lab Results  Component Value Date   WBC 4.5 02/14/2020   HGB 10.8 (L) 02/14/2020   PLT 244 02/14/2020   NA 138 02/14/2020   K 4.0 02/14/2020   CL 104 02/14/2020   CO2 27 02/14/2020   GLUCOSE 86 02/14/2020   BUN 15 02/14/2020   CREATININE 0.67 02/14/2020   BILITOT 0.6 02/14/2020   ALKPHOS 81 12/09/2016   AST 16 02/14/2020   ALT 7 02/14/2020   PROT 7.5 02/14/2020   ALBUMIN 4.1 12/09/2016   CALCIUM 9.3 02/14/2020   GFRAA 112 02/14/2020    Speciality Comments: No specialty comments available.  Procedures:  No procedures performed Allergies: Patient has no known allergies.   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  She has positive antibodies in the past.  I will repeat  autoimmune labs today.  Plan: CBC with Differential/Platelet, COMPLETE METABOLIC PANEL WITH GFR, Urinalysis, Routine w reflex microscopic, Anti-DNA antibody, double-stranded, C3 and C4, Sedimentation rate, Cardiolipin antibodies, IgG, IgM, IgA, Beta-2 glycoprotein antibodies, Lupus Anticoagulant Eval w/Reflex, Anti-Smith antibody, ANA.  We will contact her once the lab results are available.  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  She has been taking aspirin in the past which she stopped recently.  I advised her to take aspirin 81 mg p.o. every other day or daily.  Increase  Primary osteoarthritis of both knees-she denies any discomfort.  Thalassemia, unspecified type  Vitamin D deficiency -she has chronic vitamin D deficiency.  She has been taking vitamin D 5000 units daily.  We will check vitamin D level today.  Plan: VITAMIN D 25 Hydroxy (Vit-D Deficiency, Fractures)  Post-menopausal - DEXA 02/28/2020 The BMD measured at Femur Neck Left is 0.777 g/cm2 with a T-score of-1.9.  Orders: Orders Placed This Encounter  Procedures   CBC with Differential/Platelet   COMPLETE METABOLIC PANEL WITH GFR   Urinalysis, Routine w reflex microscopic   Anti-DNA antibody, double-stranded   C3 and C4   Sedimentation rate   VITAMIN D 25 Hydroxy (Vit-D Deficiency, Fractures)   Cardiolipin antibodies, IgG, IgM, IgA   Beta-2 glycoprotein antibodies   Lupus Anticoagulant Eval w/Reflex   Anti-Smith antibody   ANA    No orders of the defined types were placed in this encounter.   Follow-Up Instructions: Return in about 1 year (around 02/12/2022) for SLE.   Pollyann Savoy, MD  Note - This record has been created using Animal nutritionist.  Chart creation errors have been sought, but may not always  have been located. Such creation errors do not reflect on  the standard of medical care.

## 2021-02-08 DIAGNOSIS — Z1231 Encounter for screening mammogram for malignant neoplasm of breast: Secondary | ICD-10-CM | POA: Diagnosis not present

## 2021-02-12 ENCOUNTER — Ambulatory Visit: Payer: BC Managed Care – PPO | Admitting: Rheumatology

## 2021-02-12 ENCOUNTER — Other Ambulatory Visit: Payer: Self-pay

## 2021-02-12 ENCOUNTER — Encounter: Payer: Self-pay | Admitting: Rheumatology

## 2021-02-12 VITALS — BP 111/70 | HR 44 | Ht 66.0 in | Wt 151.4 lb

## 2021-02-12 DIAGNOSIS — D569 Thalassemia, unspecified: Secondary | ICD-10-CM | POA: Diagnosis not present

## 2021-02-12 DIAGNOSIS — M3213 Lung involvement in systemic lupus erythematosus: Secondary | ICD-10-CM

## 2021-02-12 DIAGNOSIS — E559 Vitamin D deficiency, unspecified: Secondary | ICD-10-CM

## 2021-02-12 DIAGNOSIS — R760 Raised antibody titer: Secondary | ICD-10-CM

## 2021-02-12 DIAGNOSIS — M170 Bilateral primary osteoarthritis of knee: Secondary | ICD-10-CM | POA: Diagnosis not present

## 2021-02-12 DIAGNOSIS — Z780 Asymptomatic menopausal state: Secondary | ICD-10-CM

## 2021-02-16 LAB — COMPLETE METABOLIC PANEL WITH GFR
AG Ratio: 1.4 (calc) (ref 1.0–2.5)
ALT: 8 U/L (ref 6–29)
AST: 18 U/L (ref 10–35)
Albumin: 4.5 g/dL (ref 3.6–5.1)
Alkaline phosphatase (APISO): 77 U/L (ref 37–153)
BUN: 13 mg/dL (ref 7–25)
CO2: 27 mmol/L (ref 20–32)
Calcium: 9.5 mg/dL (ref 8.6–10.4)
Chloride: 103 mmol/L (ref 98–110)
Creat: 0.66 mg/dL (ref 0.50–1.05)
Globulin: 3.3 g/dL (calc) (ref 1.9–3.7)
Glucose, Bld: 79 mg/dL (ref 65–99)
Potassium: 4.5 mmol/L (ref 3.5–5.3)
Sodium: 137 mmol/L (ref 135–146)
Total Bilirubin: 0.8 mg/dL (ref 0.2–1.2)
Total Protein: 7.8 g/dL (ref 6.1–8.1)
eGFR: 100 mL/min/{1.73_m2} (ref >=60)

## 2021-02-16 LAB — RFLX HEXAGONAL PHASE CONFIRM: Hexagonal Phase Conf: NEGATIVE

## 2021-02-16 LAB — CBC WITH DIFFERENTIAL/PLATELET
Absolute Monocytes: 445 cells/uL (ref 200–950)
Basophils Absolute: 30 cells/uL (ref 0–200)
Basophils Relative: 0.6 %
Eosinophils Absolute: 80 cells/uL (ref 15–500)
Eosinophils Relative: 1.6 %
HCT: 34.9 % — ABNORMAL LOW (ref 35.0–45.0)
Hemoglobin: 10.2 g/dL — ABNORMAL LOW (ref 11.7–15.5)
Lymphs Abs: 1910 cells/uL (ref 850–3900)
MCH: 22.7 pg — ABNORMAL LOW (ref 27.0–33.0)
MCHC: 29.2 g/dL — ABNORMAL LOW (ref 32.0–36.0)
MCV: 77.6 fL — ABNORMAL LOW (ref 80.0–100.0)
MPV: 11.3 fL (ref 7.5–12.5)
Monocytes Relative: 8.9 %
Neutro Abs: 2535 cells/uL (ref 1500–7800)
Neutrophils Relative %: 50.7 %
Platelets: 249 10*3/uL (ref 140–400)
RBC: 4.5 10*6/uL (ref 3.80–5.10)
RDW: 12.8 % (ref 11.0–15.0)
Total Lymphocyte: 38.2 %
WBC: 5 10*3/uL (ref 3.8–10.8)

## 2021-02-16 LAB — BETA-2 GLYCOPROTEIN ANTIBODIES
Beta-2 Glyco 1 IgA: <2 U/mL
Beta-2 Glyco 1 IgM: <2 U/mL
Beta-2 Glyco I IgG: 14.9 U/mL

## 2021-02-16 LAB — URINALYSIS, ROUTINE W REFLEX MICROSCOPIC
Bilirubin Urine: NEGATIVE
Glucose, UA: NEGATIVE
Hgb urine dipstick: NEGATIVE
Ketones, ur: NEGATIVE
Leukocytes,Ua: NEGATIVE
Nitrite: NEGATIVE
Protein, ur: NEGATIVE
Specific Gravity, Urine: 1.02 (ref 1.001–1.035)
pH: 5.5 (ref 5.0–8.0)

## 2021-02-16 LAB — SEDIMENTATION RATE: Sed Rate: 28 mm/h (ref 0–30)

## 2021-02-16 LAB — C3 AND C4
C3 Complement: 102 mg/dL (ref 83–193)
C4 Complement: 21 mg/dL (ref 15–57)

## 2021-02-16 LAB — VITAMIN D 25 HYDROXY (VIT D DEFICIENCY, FRACTURES): Vit D, 25-Hydroxy: 31 ng/mL (ref 30–100)

## 2021-02-16 LAB — CARDIOLIPIN ANTIBODIES, IGG, IGM, IGA
Anticardiolipin IgA: <2 APL-U/mL
Anticardiolipin IgG: 21.3 GPL-U/mL — ABNORMAL HIGH
Anticardiolipin IgM: <2 MPL-U/mL

## 2021-02-16 LAB — LUPUS ANTICOAGULANT EVAL W/ REFLEX
PTT-LA Screen: 51 s — ABNORMAL HIGH (ref <=40)
dRVVT: 43 s (ref <=45)

## 2021-02-16 LAB — ANA: Anti Nuclear Antibody (ANA): NEGATIVE

## 2021-02-16 LAB — ANTI-DNA ANTIBODY, DOUBLE-STRANDED: ds DNA Ab: 1 IU/mL

## 2021-02-16 LAB — ANTI-SMITH ANTIBODY: ENA SM Ab Ser-aCnc: <1 AI

## 2021-02-16 NOTE — Progress Notes (Signed)
Anemia noted which is a stable.  CMP normal.  UA negative.  Double-stranded DNA negative.  Complements, sed rate and vitamin D are normal.  Anticardiolipin IgG is mildly elevated, beta-2 GP 1 negative, lupus anticoagulant negative.  No change in treatment advised.

## 2021-09-04 DIAGNOSIS — M5412 Radiculopathy, cervical region: Secondary | ICD-10-CM | POA: Diagnosis not present

## 2021-09-04 DIAGNOSIS — Z13220 Encounter for screening for lipoid disorders: Secondary | ICD-10-CM | POA: Diagnosis not present

## 2021-09-04 DIAGNOSIS — Z0000 Encounter for general adult medical examination without abnormal findings: Secondary | ICD-10-CM | POA: Diagnosis not present

## 2021-10-02 ENCOUNTER — Encounter (HOSPITAL_COMMUNITY): Payer: Self-pay | Admitting: Physical Therapy

## 2021-10-02 ENCOUNTER — Other Ambulatory Visit: Payer: Self-pay

## 2021-10-02 ENCOUNTER — Ambulatory Visit (HOSPITAL_COMMUNITY): Payer: BC Managed Care – PPO | Attending: Family Medicine | Admitting: Physical Therapy

## 2021-10-02 DIAGNOSIS — Z789 Other specified health status: Secondary | ICD-10-CM | POA: Diagnosis not present

## 2021-10-02 DIAGNOSIS — R29898 Other symptoms and signs involving the musculoskeletal system: Secondary | ICD-10-CM | POA: Insufficient documentation

## 2021-10-02 NOTE — Patient Instructions (Signed)
Exercises ?Thoracic Extension Mobilization on Foam Roll - 3-4 x daily - 7 x weekly - 2 sets - 10 reps - 2s hold ?Supine Suboccipital Release with Tennis Balls - 3-4 x daily - 7 x weekly - 2 reps - hold ?Standing Shoulder Diagonal Horizontal Abduction 60/120 Degrees with Resistance - 2-3 x daily - 7 x weekly - 3 sets - 12 reps ?

## 2021-10-02 NOTE — Therapy (Signed)
?OUTPATIENT PHYSICAL THERAPY CERVICAL EVALUATION ? ? ?Patient Name: Alicia Garcia ?MRN: [id number] ?DOB:Aug 22, 1960, 61 y.o., female ?Today's Date: 10/02/2021 ? ? PT End of Session - 10/02/21 1252   ? ? Visit Number 1   ? Number of Visits 5   ? Date for PT Re-Evaluation 11/06/21   ? Authorization Type [employer] PPO   ? Authorization Time Period 07/14/2021 - 07/13/2022   ? Authorization - Visit Number 1   ? Authorization - Number of Visits 30   ? Progress Note Due on Visit 10   ? PT Start Time [phone]   ? PT Stop Time 0905   ? PT Time Calculation (min) 48 min   ? Activity Tolerance Patient tolerated treatment well   ? Behavior During Therapy [facility] for tasks assessed/performed   ? ?  ?  ? ?  ? ? ?Past Medical History:  ?Diagnosis Date  ? Anemia   ? Heart murmur   ? at birth, it closed up  ? Lupus - trait only   ? Thalassemia trait   ? causes her anemia  ? ?Past Surgical History:  ?Procedure Laterality Date  ? COLONOSCOPY    ? 2012, normal   ? TONSILLECTOMY    ? TUBAL LIGATION    ? ?Patient Active Problem List  ? Diagnosis Date Noted  ? 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 Thalassemia 08/13/2016  ? Vitamin D deficiency 06/24/2016  ? Systemic lupus erythematosus (HCC) 06/24/2016  ? Primary osteoarthritis of both knees 06/24/2016  ? ? ?PCP: Shirlean Mylar, MD ? ?REFERRING PROVIDER: Carolin Coy, MD ? ?REFERRING DIAG: Radiculopathy, cervical region ? ?THERAPY DIAG:  ?Deficit in activities of daily living (ADL) ? ?Other symptoms and signs involving the musculoskeletal system ? ?Right arm weakness ? ?ONSET DATE: 08/28/2021 ? ? ?SUBJECTIVE:                                                                                                                                                                                                        ? ?SUBJECTIVE STATEMENT: ?Patient reports that her pain started about 4-5 weeks ago when her workload increased. 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 She  believes that she has a good desk setup at work and is able to rest her forearms on the desk while typing in addition to looking straight forward at the screen. ? ?PERTINENT HISTORY:  ?Rt shoulder about 5 years ago for which she went to therapy. ? ?PAIN:  ?Are you having pain? Yes: NPRS scale: 7/10 ?  Pain location: Rt arm(distance of radiation varies and can go all the way down to the knuckles) ?Pain description: Burning ?Aggravating factors: Lying on her Lt side and extended periods of desk work. ?Relieving factors: Lying on her Rt side ? ?PRECAUTIONS: None ? ?WEIGHT BEARING RESTRICTIONS No ? ?FALLS:  ?Has patient fallen in last 6 months? No ?Number of falls: N/A ? ?LIVING ENVIRONMENT: ?Lives with: lives with their spouse ?Lives in: House/apartment ? ?OCCUPATION: Desk Job ? ?PLOF: Independent ? ?PATIENT GOALS Be able to sleep through the night without waking up to pain and completing a full days work without an onset of pain. ? ?OBJECTIVE:  ? ?DIAGNOSTIC FINDINGS:  ?MRI of cervical spine from 3 years prior indicates, "MRI cervical spine (with and without) demonstrating: ?- Minimal degenerative changes as above. No spinal stenosis or foraminal narrowing. ?- No intrinsic, compressive or abnormal enhancing spinal cord lesions." ? ?PATIENT SURVEYS:  ?FOTO 54%(68% predicted by #10) ? ? ?COGNITION: ?Overall cognitive status: Within functional limits for tasks assessed ? ? ?SENSATION: ?Denies sensation changes ? ?POSTURE:  ?Forward shoulders ? ?PALPATION: ?TTP over the Rt UT  ? ?CERVICAL ROM:  ? ?Active ROM A/PROM (deg) ?10/02/2021  ?Flexion 34  ?Extension 51  ?Right lateral flexion 28(Rt cervical soreness)  ?Left lateral flexion 32  ?Right rotation 62(Rt cervical soreness)  ?Left rotation 63  ? (Blank rows = not tested) ? ?UE ROM: ? ?Active ROM Right ?10/02/2021 Left ?10/02/2021  ?Shoulder flexion    ?Shoulder extension    ?Shoulder abduction    ?Shoulder adduction    ?Shoulder extension    ?Shoulder internal rotation     ?Shoulder external rotation    ?Elbow flexion    ?Elbow extension    ?Wrist flexion    ?Wrist extension    ?Wrist ulnar deviation    ?Wrist radial deviation    ?Wrist pronation    ?Wrist supination    ?Grip Strength(position 2)    ? (Blank rows = not tested) ? ?UE MMT: ? ?MMT Right ?10/02/2021 Left ?10/02/2021  ?Shoulder flexion 4(previous shoulder injury) 4+  ?Shoulder extension 4+ 5  ?Shoulder abduction 4-(previous shoulder injury) 5  ?Shoulder adduction    ?Shoulder extension    ?Shoulder internal rotation    ?Shoulder external rotation    ?Middle trapezius    ?Lower trapezius    ?Elbow flexion 5 5  ?Elbow extension 4+ 5  ?Wrist flexion 4 5  ?Wrist extension 5 5  ?Wrist ulnar deviation    ?Wrist radial deviation    ?Wrist pronation    ?Wrist supination    ?Grip strength 50.5lbs 57.5lbs  ? (Blank rows = not tested) ? ?CERVICAL SPECIAL TESTS:  ?Spurling's test: Negative and Distraction test: Positive ? ?TODAY'S TREATMENT:  ?Thoracic extensions over towel roll w/ deep breathing 1x10x2s holds ?Suboccipital release x45s ? ? ?PATIENT EDUCATION:  ?Education details: HEP, POC, cervical anatomy, ergonomics, and posture control. ?Person educated: Patient ?Education method: Explanation, Demonstration, and Handouts ?Education comprehension: verbalized understanding and returned demonstration ? ? ?HOME EXERCISE PROGRAM: ?Thoracic Extension Mobilization on Foam Roll - 3-4 x daily - 7 x weekly - 2 sets - 10 reps - 2s hold ?Supine Suboccipital Release with Tennis Balls - 3-4 x daily - 7 x weekly - 2 reps - hold ?Standing Shoulder Diagonal Horizontal Abduction 60/120 Degrees with Resistance - 2-3 x daily - 7 x weekly - 3 sets - 12 reps ? ?ASSESSMENT: ? ?CLINICAL IMPRESSION: ?Patient is a 61 y.o. female presenting to physical  therapy with c/o cervical discomfort and pain radiating into the RUE following no specific injury. Of note, she does also have a previous Rt shoulder rotator cuff related injury. She presents with pain  limited deficits in wrist and tricep strength, Rt shoulder ROM, postural impairments, spinal mobility, and functional mobility with ADL. She is having to modify and restrict ADL as indicated by FOTO score as well as subjective information and objective measures which is affecting overall participation. Patient will benefit from skilled physical therapy in order to improve function and reduce impairment.  ? ? ?OBJECTIVE IMPAIRMENTS decreased activity tolerance, decreased strength, hypomobility, postural dysfunction, and pain.  ? ?ACTIVITY LIMITATIONS cleaning, community activity, driving, meal prep, occupation, laundry, yard work, and yard work.  ? ?PERSONAL FACTORS Age are also affecting patient's functional outcome.  ? ? ?REHAB POTENTIAL: Good ? ?CLINICAL DECISION MAKING: Stable/uncomplicated ? ?EVALUATION COMPLEXITY: Low ? ? ?GOALS: ?Goals reviewed with patient? No ? ?LONG TERM GOALS: Target date: 11/06/2021 ? ?Patient will achieve pain free cervical AROM in all 6 directions. ?Baseline: Restricted with Rt rotation and SB ?Goal status: INITIAL ? ?2.  Patient will be able to complete a full work day without any radiation of pain down into the RUE that cannot be relieved with a brief repositioning of posture. ?Baseline: Frequent pain during work. ?Goal status: INITIAL ? ?3.  Patient will have a Rt grip strength at least equal to that of the Lt hand. ?Baseline: 50.5lbs ?Goal status: INITIAL ? ?4.  Patient will be able to reach Rt rotation cervical end ROM without an onset of pain greater than 1/10. ?Baseline: 62 ?Goal status: INITIAL ? ?5.  Patient will be able to reach Rt cervical side bending end ROM without an onset of pain greater than 1/10. ?Baseline: 28 ?Goal status: INITIAL ? ?6.  Patient will be independent with her end HEP. ?Baseline: N/A ?Goal status: INITIAL ? ? ?PLAN: ?PT FREQUENCY: 1x/week ? ?PT DURATION: other: 5 weeks ? ?PLANNED INTERVENTIONS: Therapeutic exercises, Therapeutic activity, Neuromuscular  re-education, Balance training, Gait training, Patient/Family education, Joint mobilization, Spinal manipulation, Spinal mobilization, and Manual therapy ? ?PLAN FOR NEXT SESSION: Continue with Thoracic spine mob

## 2021-10-10 ENCOUNTER — Ambulatory Visit (HOSPITAL_COMMUNITY): Payer: BC Managed Care – PPO

## 2021-10-10 DIAGNOSIS — R29898 Other symptoms and signs involving the musculoskeletal system: Secondary | ICD-10-CM | POA: Diagnosis not present

## 2021-10-10 DIAGNOSIS — Z789 Other specified health status: Secondary | ICD-10-CM | POA: Diagnosis not present

## 2021-10-10 NOTE — Therapy (Signed)
OUTPATIENT PHYSICAL THERAPY TREATMENT NOTE   Patient Name: Alicia Garcia MRN: [id number] DOB:1961/01/20, 61 y.o., female Today's Date: 10/10/2021  PCP: Shirlean Mylar, MD REFERRING PROVIDER: Shirlean Mylar, MD   PT End of Session - 10/10/21 [phone]     Visit Number 2    Number of Visits 5    Date for PT Re-Evaluation 11/06/21    Authorization Type [employer] PPO    Authorization Time Period 07/14/2021 - 07/13/2022    Authorization - Visit Number 1    Authorization - Number of Visits 30    Progress Note Due on Visit 10    PT Start Time 0821    PT Stop Time 0904    PT Time Calculation (min) 43 min    Activity Tolerance Patient tolerated treatment well    Behavior During Therapy [facility] for tasks assessed/performed             Past Medical History:  Diagnosis Date   Anemia    Heart murmur    at birth, it closed up   Lupus - trait only    Thalassemia trait    causes her anemia   Past Surgical History:  Procedure Laterality Date   COLONOSCOPY     2012, normal    TONSILLECTOMY     TUBAL LIGATION     Patient Active Problem List   Diagnosis Date Noted   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  Maybe 20% better. Reports neck stiffness and R arm pain today.  PAIN:  Are you having pain? Yes: NPRS scale: 7/10 Pain location: neck and Right arm down to middle of upper arm Pain description: burning Aggravating factors: carrying something heavy Relieving factors: lying on Right side   PCP: Shirlean Mylar, MD   REFERRING  PROVIDER: Carolin Coy, MD   REFERRING DIAG: Radiculopathy, cervical region   THERAPY DIAG:  Deficit in activities of daily living (ADL)   Other symptoms and signs involving the musculoskeletal system   Right arm weakness   ONSET DATE: 08/28/2021       PERTINENT HISTORY:  Rt shoulder about 5 years ago for which she went to therapy.   PAIN:  Are you having pain? Yes: NPRS scale: 7/10 Pain location: Rt arm(distance of radiation varies and can go all the way down to the knuckles) Pain description: Burning Aggravating factors: Lying on her Lt side and extended periods of desk work. Relieving factors: Lying on her Rt side   PRECAUTIONS: None   WEIGHT BEARING RESTRICTIONS No   FALLS:  Has patient fallen in last 6 months? No Number of falls: N/A   LIVING ENVIRONMENT: Lives with: lives with their spouse Lives in: House/apartment   OCCUPATION: Desk Job   PLOF: Independent   PATIENT GOALS Be able to sleep through the night without waking up to pain and completing a full days work without an onset of pain.   OBJECTIVE:    DIAGNOSTIC FINDINGS:  MRI of cervical spine from 3 years prior indicates, "MRI cervical spine (with and without) demonstrating: - Minimal degenerative changes as above.  No spinal stenosis or foraminal narrowing. - No intrinsic, compressive or abnormal enhancing spinal cord lesions."   PATIENT SURVEYS:  FOTO 54%(68% predicted by #10)     COGNITION: Overall cognitive status: Within functional limits for tasks assessed     SENSATION: Denies sensation changes   POSTURE:  Forward shoulders   PALPATION: TTP over the Rt UT     CERVICAL ROM:    Active ROM A/PROM (deg) 10/02/2021  Flexion 34  Extension 51  Right lateral flexion 28(Rt cervical soreness)  Left lateral flexion 32  Right rotation 62(Rt cervical soreness)  Left rotation 63   (Blank rows = not tested)   UE ROM:   Active ROM Right 10/02/2021 Left 10/02/2021  Shoulder  flexion      Shoulder extension      Shoulder abduction      Shoulder adduction      Shoulder extension      Shoulder internal rotation      Shoulder external rotation      Elbow flexion      Elbow extension      Wrist flexion      Wrist extension      Wrist ulnar deviation      Wrist radial deviation      Wrist pronation      Wrist supination      Grip Strength(position 2)       (Blank rows = not tested)   UE MMT:   MMT Right 10/02/2021 Left 10/02/2021  Shoulder flexion 4(previous shoulder injury) 4+  Shoulder extension 4+ 5  Shoulder abduction 4-(previous shoulder injury) 5  Shoulder adduction      Shoulder extension      Shoulder internal rotation      Shoulder external rotation      Middle trapezius      Lower trapezius      Elbow flexion 5 5  Elbow extension 4+ 5  Wrist flexion 4 5  Wrist extension 5 5  Wrist ulnar deviation      Wrist radial deviation      Wrist pronation      Wrist supination      Grip strength 50.5lbs 57.5lbs   (Blank rows = not tested)   CERVICAL SPECIAL TESTS:  Spurling's test: Negative and Distraction test: Positive   TODAY'S TREATMENT:  Review of HEP and goals Seated cervical retractions x 10 Supine cervical retractions x 10 Supine cervical retractions with overpressure x 10 Supine scap retractions x 10 Manual STW and cervical traction x 15 min; no other treatment performed during manual treatment.    PATIENT EDUCATION:  Education details: HEP, POC, cervical anatomy, ergonomics, and posture control. Person educated: Patient Education method: Explanation, Demonstration, and Handouts Education comprehension: verbalized understanding and returned demonstration     HOME EXERCISE PROGRAM: 10/10/21 Access Code: [email or web address] URL: [email or web address] Date: 10/10/2021 Prepared by: [facility] - Rehab  Exercises - Seated Cervical Retraction  - 1 x daily - 7 x weekly - 1 sets - 10 reps - Supine Chin Tuck  - 5 x daily - 7 x  weekly - 1 sets - 10 reps - Supine Passive Cervical Retraction  - 5 x daily - 7 x weekly - 1 sets - 10 reps - Supine Scapular Retraction  - 3 x daily - 7 x weekly - 1 sets - 10 reps  Eval: Thoracic Extension Mobilization on Foam Roll - 3-4 x daily - 7 x weekly - 2 sets - 10 reps - 2s  hold Supine Suboccipital Release with Tennis Balls - 3-4 x daily - 7 x weekly - 2 reps - hold Standing Shoulder Diagonal Horizontal Abduction 60/120 Degrees with Resistance - 2-3 x daily - 7 x weekly - 3 sets - 12 reps   ASSESSMENT:   CLINICAL IMPRESSION:  Patient continues with neck and shoulder/ Right upper arm pain.  Today she reports decreased arm pain with Cervical retractions with overpressure.  She continues with noted tightness of Right upper trap and forward shoulder Right more than Left.  After treatment patient reports less stiffness in the neck and less pain into her Right arm 5/10.  She continues with pain with cervical Right Rotation.  Patient will benefit from continued skilled physical therapy in order to improve function and reduce impairment.      OBJECTIVE IMPAIRMENTS decreased activity tolerance, decreased strength, hypomobility, postural dysfunction, and pain.    ACTIVITY LIMITATIONS cleaning, community activity, driving, meal prep, occupation, laundry, yard work, and yard work.    PERSONAL FACTORS Age are also affecting patient's functional outcome.      REHAB POTENTIAL: Good   CLINICAL DECISION MAKING: Stable/uncomplicated   EVALUATION COMPLEXITY: Low     GOALS: Goals reviewed with patient? No   LONG TERM GOALS: Target date: 11/06/2021   Patient will achieve pain free cervical AROM in all 6 directions. Baseline: Restricted with Rt rotation and SB Goal status: INITIAL   2.  Patient will be able to complete a full work day without any radiation of pain down into the RUE that cannot be relieved with a brief repositioning of posture. Baseline: Frequent pain during work. Goal  status: INITIAL   3.  Patient will have a Rt grip strength at least equal to that of the Lt hand. Baseline: 50.5lbs Goal status: INITIAL   4.  Patient will be able to reach Rt rotation cervical end ROM without an onset of pain greater than 1/10. Baseline: 62 Goal status: INITIAL   5.  Patient will be able to reach Rt cervical side bending end ROM without an onset of pain greater than 1/10. Baseline: 28 Goal status: INITIAL   6.  Patient will be independent with her end HEP. Baseline: N/A Goal status: INITIAL     PLAN: PT FREQUENCY: 1x/week   PT DURATION: other: 5 weeks   PLANNED INTERVENTIONS: Therapeutic exercises, Therapeutic activity, Neuromuscular re-education, Balance training, Gait training, Patient/Family education, Joint mobilization, Spinal manipulation, Spinal mobilization, and Manual therapy   PLAN FOR NEXT SESSION: Assess patient reaction to cervical retractions with HEP.  Progress postural strengthening exercises as able; continue with STW and manual traction to cervical spine.     9:14 AM, 10/10/21 Zamariya Neal Small Amyri Frenz MPT [facility] [place] [phone]

## 2021-10-16 ENCOUNTER — Ambulatory Visit (HOSPITAL_COMMUNITY): Payer: BC Managed Care – PPO | Attending: Family Medicine

## 2021-10-16 ENCOUNTER — Encounter (HOSPITAL_COMMUNITY): Payer: Self-pay

## 2021-10-16 DIAGNOSIS — Z789 Other specified health status: Secondary | ICD-10-CM | POA: Diagnosis not present

## 2021-10-16 DIAGNOSIS — R29898 Other symptoms and signs involving the musculoskeletal system: Secondary | ICD-10-CM | POA: Insufficient documentation

## 2021-10-16 NOTE — Therapy (Signed)
?OUTPATIENT PHYSICAL THERAPY TREATMENT NOTE ? ? ?Patient Name: Alicia Garcia ?MRN: [id number] ?DOB:May 08, 1961, 61 y.o., female ?Today's Date: 10/16/2021 ? ?PCP: Shirlean Mylar, MD ?REFERRING PROVIDER: Carolin Coy, MD ? ? PT End of Session - 10/16/21 0914   ? ? Visit Number 3   ? Number of Visits 5   ? Date for PT Re-Evaluation 11/06/21   ? Authorization Type [employer] PPO   ? Authorization Time Period 07/14/2021 - 07/13/2022   ? Authorization - Visit Number 2   ? Authorization - Number of Visits 30   ? Progress Note Due on Visit 10   ? PT Start Time [phone]   ? PT Stop Time 0912   ? PT Time Calculation (min) 40 min   ? Activity Tolerance Patient tolerated treatment well   ? Behavior During Therapy [facility] for tasks assessed/performed   ? ?  ?  ? ?  ? ? ? ?Past Medical History:  ?Diagnosis Date  ? Anemia   ? Heart murmur   ? at birth, it closed up  ? Lupus - trait only   ? Thalassemia trait   ? causes her anemia  ? ?Past Surgical History:  ?Procedure Laterality Date  ? COLONOSCOPY    ? 2012, normal   ? TONSILLECTOMY    ? TUBAL LIGATION    ? ?Patient Active Problem List  ? Diagnosis Date Noted  ? 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 Thalassemia 08/13/2016  ? Vitamin D deficiency 06/24/2016  ? Systemic lupus erythematosus (HCC) 06/24/2016  ? Primary osteoarthritis of both knees 06/24/2016  ? ? ?REFERRING DIAG: radiculopathy cervical regioin ? ?THERAPY DIAG:  ?Deficit in activities of daily living (ADL) ? ?Other symptoms and signs involving the musculoskeletal system ? ?Right arm weakness ? ?PERTINENT HISTORY: none ? ?PRECAUTIONS: none ? ?SUBJECTIVE: Patient stated she has intermittent pain on Rt side of neck and radicular symptoms past Rt elbow, pain scale 5/10.  Reports complinace iwht HEP a couple times a week.  ? ?PAIN:  ?Are you having pain? Yes: NPRS scale: 5/10 ?Pain location: neck and Right arm down to middle of upper arm ?Pain description: burning ?Aggravating factors: carrying something heavy ?Relieving  factors: lying on Right side ? ? ?PCP: Shirlean Mylar, MD ?  ?REFERRING PROVIDER: Carolin Coy, MD ?  ?REFERRING DIAG: Radiculopathy, cervical region ?  ?THERAPY DIAG:  ?Deficit in activities of daily living (ADL) ?  ?Other symptoms and signs involving the musculoskeletal system ?  ?Right arm weakness ?  ?ONSET DATE: 08/28/2021 ?  ?  ?  ?PERTINENT HISTORY:  ?Rt shoulder about 5 years ago for which she went to therapy. ?  ?PAIN:  ?Are you having pain? 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 ?Relieving factors: Lying on her Rt side ?  ?PRECAUTIONS: None ?  ?WEIGHT BEARING RESTRICTIONS No ?  ?FALLS:  ?Has patient fallen in last 6 months? No ?Number of falls: N/A ?  ?LIVING ENVIRONMENT: ?Lives with: lives with their spouse ?Lives in: House/apartment ?  ?OCCUPATION: Desk Job ?  ?PLOF: Independent ?  ?PATIENT GOALS Be able to sleep through the night without waking up to pain and completing a full days work without an onset of pain. ?  ?OBJECTIVE:  ?  ?DIAGNOSTIC FINDINGS:  ?MRI of cervical spine from 3 years prior indicates, "MRI  cervical spine (with and without) demonstrating: ?- Minimal degenerative changes as above. No spinal stenosis or foraminal narrowing. ?- No intrinsic, compressive or abnormal enhancing spinal cord lesions." ?  ?PATIENT SURVEYS:  ?FOTO 54%(68% predicted by #10) ?  ?  ?COGNITION: ?Overall cognitive status: Within functional limits for tasks assessed ?  ?  ?SENSATION: ?Denies sensation changes ?  ?POSTURE:  ?Forward shoulders ?  ?PALPATION: ?TTP over the Rt UT   ?  ?CERVICAL ROM:  ?  ?Active ROM A/PROM (deg) ?10/02/2021  ?Flexion 34  ?Extension 51  ?Right lateral flexion 28(Rt cervical soreness)  ?Left lateral flexion 32  ?Right rotation 62(Rt cervical soreness)  ?Left rotation 63  ? (Blank rows = not tested) ?  ?UE  ROM: ?  ?Active ROM Right ?10/02/2021 Left ?10/02/2021  ?Shoulder flexion      ?Shoulder extension      ?Shoulder abduction      ?Shoulder adduction      ?Shoulder extension      ?Shoulder internal rotation      ?Shoulder external rotation      ?Elbow flexion      ?Elbow extension      ?Wrist flexion      ?Wrist extension      ?Wrist ulnar deviation      ?Wrist radial deviation      ?Wrist pronation      ?Wrist supination      ?Grip Strength(position 2)      ? (Blank rows = not tested) ?  ?UE MMT: ?  ?MMT Right ?10/02/2021 Left ?10/02/2021  ?Shoulder flexion 4(previous shoulder injury) 4+  ?Shoulder extension 4+ 5  ?Shoulder abduction 4-(previous shoulder injury) 5  ?Shoulder adduction      ?Shoulder extension      ?Shoulder internal rotation      ?Shoulder external rotation      ?Middle trapezius      ?Lower trapezius      ?Elbow flexion 5 5  ?Elbow extension 4+ 5  ?Wrist flexion 4 5  ?Wrist extension 5 5  ?Wrist ulnar deviation      ?Wrist radial deviation      ?Wrist pronation      ?Wrist supination      ?Grip strength 50.5lbs 57.5lbs  ? (Blank rows = not tested) ?  ?CERVICAL SPECIAL TESTS:  ?Spurling's test: Negative and Distraction test: Positive ?  ?TODAY'S TREATMENT:  ?   10/16/21: ?   Seated: ?    Cervical retraction with over pressure ?    Cervical retraction with over pressure ?    Scapular retraction ?    Wback ?    3D cervical and thoracic excursion 5x each pain free range ?    Pec stretch 3x 30" ? ?  STM supine position with LE elevated, cervical traction 2x 30" ? ?   10/10/21: ?Review of HEP and goals ?Seated cervical retractions x 10 ?Supine cervical retractions x 10 ?Supine cervical retractions with overpressure x 10 ?Supine scap retractions x 10 ?Manual STW and cervical traction x 15 min; no other treatment performed during manual treatment. ? ?  ?PATIENT EDUCATION:  ?Education details: HEP, POC, cervical anatomy, ergonomics, and posture control. ?Person educated: Patient ?Education method: Explanation,  Demonstration, and Handouts ?Education comprehension: verbalized understanding and returned demonstration ?  ?  ?HOME EXERCISE PROGRAM: ?10/10/21 ?Access Code: [email or web address] ?URL: [email or web address] ?Date: 10/10/2021 ?Prepared by: [facility] - Rehab ? ?Exercises ?- Seated Cervical Retraction  - 1 x daily - 7 x  weekly - 1 sets - 10 reps ?- Supine Chin Tuck  - 5 x daily - 7 x weekly - 1 sets - 10 reps ?- Supine Passive Cervical Retraction  - 5 x daily - 7 x weekly - 1 sets - 10 reps ?- Supine Scapular Retraction  - 3 x daily - 7 x weekly - 1 sets - 10 reps ? ?Eval: ?Thoracic Extension Mobilization on Foam Roll - 3-4 x daily - 7 x weekly - 2 sets - 10 reps - 2s hold ?Supine Suboccipital Release with Tennis Balls - 3-4 x daily - 7 x weekly - 2 reps - 2min hold ?Standing Shoulder Diagonal Horizontal Abduction 60/120 Degrees with Resistance - 2-3 x daily - 7 x weekly - 3 sets - 12 reps ?  ?ASSESSMENT: ?  ?CLINICAL IMPRESSION: ? Added excursions for mobility and progressed to seated postural strengthening.  Pt limited Rt UE pain, added pec stretch to address mobility which pt was initially nervous though able to complete pain free.  Added 3D cervical excursion, seated cervical and scapular retraction to HEP.  EOS with STM to address moderate spasms UT and levator.  Add UT stretch next session.   ?  ?  ?OBJECTIVE IMPAIRMENTS decreased activity tolerance, decreased strength, hypomobility, postural dysfunction, and pain.  ?  ?ACTIVITY LIMITATIONS cleaning, community activity, driving, meal prep, occupation, laundry, yard work, and yard work.  ?  ?PERSONAL FACTORS Age are also affecting patient's functional outcome.  ?  ?  ?REHAB POTENTIAL: Good ?  ?CLINICAL DECISION MAKING: Stable/uncomplicated ?  ?EVALUATION COMPLEXITY: Low ?  ?  ?GOALS: ?Goals reviewed with patient? No ?  ?LONG TERM GOALS: Target date: 11/06/2021 ?  ?Patient will achieve pain free cervical AROM in all 6 directions. ?Baseline: Restricted with Rt rotation  and SB ?Goal status: INITIAL ?  ?2.  Patient will be able to complete a full work day without any radiation of pain down into the RUE that cannot be relieved with a brief repositioning of posture. ?Baseli

## 2021-10-23 ENCOUNTER — Encounter (HOSPITAL_COMMUNITY): Payer: BC Managed Care – PPO

## 2021-10-24 ENCOUNTER — Ambulatory Visit (HOSPITAL_COMMUNITY): Payer: BC Managed Care – PPO | Admitting: Physical Therapy

## 2021-10-24 DIAGNOSIS — R29898 Other symptoms and signs involving the musculoskeletal system: Secondary | ICD-10-CM

## 2021-10-24 DIAGNOSIS — Z789 Other specified health status: Secondary | ICD-10-CM | POA: Diagnosis not present

## 2021-10-24 NOTE — Therapy (Signed)
?OUTPATIENT PHYSICAL THERAPY TREATMENT NOTE ? ? ?Patient Name: Alicia Garcia ?MRN: [id number] ?DOB:Jun 17, 1961, 61 y.o., female ?Today's Date: 10/24/2021 ? ?PCP: Shirlean Mylar, MD ?REFERRING PROVIDER: Carolin Coy, MD ? ? PT End of Session - 10/24/21 1617   ? ? Visit Number 4   ? Number of Visits 5   ? Date for PT Re-Evaluation 11/06/21   ? Authorization Type [employer] PPO   ? Authorization Time Period 07/14/2021 - 07/13/2022   ? Authorization - Visit Number 2   ? Authorization - Number of Visits 30   ? Progress Note Due on Visit 10   ? PT Start Time 1620   ? PT Stop Time 1700   ? PT Time Calculation (min) 40 min   ? Activity Tolerance Patient tolerated treatment well   ? Behavior During Therapy [facility] for tasks assessed/performed   ? ?  ?  ? ?  ? ? ? ?Past Medical History:  ?Diagnosis Date  ? Anemia   ? Heart murmur   ? at birth, it closed up  ? Lupus - trait only   ? Thalassemia trait   ? causes her anemia  ? ?Past Surgical History:  ?Procedure Laterality Date  ? COLONOSCOPY    ? 2012, normal   ? TONSILLECTOMY    ? TUBAL LIGATION    ? ?Patient Active Problem List  ? Diagnosis Date Noted  ? 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 Thalassemia 08/13/2016  ? Vitamin D deficiency 06/24/2016  ? Systemic lupus erythematosus (HCC) 06/24/2016  ? Primary osteoarthritis of both knees 06/24/2016  ? ? ?REFERRING DIAG: radiculopathy cervical regioin ? ?THERAPY DIAG:  ?Deficit in activities of daily living (ADL) ? ?Other symptoms and signs involving the musculoskeletal system ? ?Right arm weakness ? ?PERTINENT HISTORY: none ? ?PRECAUTIONS: none ? ?SUBJECTIVE:  ? ? ?PAIN:  ?Are you having pain? Yes: NPRS scale: 4/10 ?Pain location: neck and Right arm down to middle of upper arm ?Pain description: burning ?Aggravating factors: carrying something heavy ?Relieving factors: lying on Right side ? ? ?PCP: Shirlean Mylar, MD ?  ?REFERRING PROVIDER: Carolin Coy, MD ?  ?REFERRING DIAG: Radiculopathy, cervical region ?  ?THERAPY  DIAG:  ?Deficit in activities of daily living (ADL) ?  ?Other symptoms and signs involving the musculoskeletal system ?  ?Right arm weakness ?  ?ONSET DATE: 08/28/2021 ?  ?  ?  ?PERTINENT HISTORY:  ?Rt shoulder about 5 years ago for which she went to therapy. ?  ?PAIN:  ?Are you having pain? 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 ?Relieving factors: Lying on her Rt side ?  ?PRECAUTIONS: None ?  ?WEIGHT BEARING RESTRICTIONS No ?  ?FALLS:  ?Has patient fallen in last 6 months? No ?Number of falls: N/A ?  ?LIVING ENVIRONMENT: ?Lives with: lives with their spouse ?Lives in: House/apartment ?  ?OCCUPATION: Desk Job ?  ?PLOF: Independent ?  ?PATIENT GOALS Be able to sleep through the night without waking up to pain and completing a full days work without an onset of pain. ?  ?OBJECTIVE:  ?  ?DIAGNOSTIC FINDINGS:  ?MRI of cervical spine from 3 years prior indicates, "MRI cervical spine (with and without) demonstrating: ?- Minimal degenerative changes as above. No spinal stenosis or foraminal narrowing. ?- No intrinsic, compressive or abnormal enhancing spinal cord lesions." ?  ?  PATIENT SURVEYS:  ?FOTO 54%(68% predicted by #10) ?  ?  ?COGNITION: ?Overall cognitive status: Within functional limits for tasks assessed ?  ?  ?SENSATION: ?Denies sensation changes ?  ?POSTURE:  ?Forward shoulders ?  ?PALPATION: ?TTP over the Rt UT   ?  ?CERVICAL ROM:  ?  ?Active ROM A/PROM (deg) ?10/02/2021  ?Flexion 34  ?Extension 51  ?Right lateral flexion 28(Rt cervical soreness)  ?Left lateral flexion 32  ?Right rotation 62(Rt cervical soreness)  ?Left rotation 63  ? (Blank rows = not tested) ?  ?UE ROM: ?  ?Active ROM Right ?10/02/2021 Left ?10/02/2021  ?Shoulder flexion      ?Shoulder extension      ?Shoulder abduction      ?Shoulder adduction      ?Shoulder  extension      ?Shoulder internal rotation      ?Shoulder external rotation      ?Elbow flexion      ?Elbow extension      ?Wrist flexion      ?Wrist extension      ?Wrist ulnar deviation      ?Wrist radial deviation      ?Wrist pronation      ?Wrist supination      ?Grip Strength(position 2)      ? (Blank rows = not tested) ?  ?UE MMT: ?  ?MMT Right ?10/02/2021 Left ?10/02/2021  ?Shoulder flexion 4(previous shoulder injury) 4+  ?Shoulder extension 4+ 5  ?Shoulder abduction 4-(previous shoulder injury) 5  ?Shoulder adduction      ?Shoulder extension      ?Shoulder internal rotation      ?Shoulder external rotation      ?Middle trapezius      ?Lower trapezius      ?Elbow flexion 5 5  ?Elbow extension 4+ 5  ?Wrist flexion 4 5  ?Wrist extension 5 5  ?Wrist ulnar deviation      ?Wrist radial deviation      ?Wrist pronation      ?Wrist supination      ?Grip strength 50.5lbs 57.5lbs  ? (Blank rows = not tested) ?  ?CERVICAL SPECIAL TESTS:  ?Spurling's test: Negative and Distraction test: Positive ?  ?TODAY'S TREATMENT:  ?10/23/21:  supine:  cervical and scapular retraction x 10 ?             Sitting   :  SNAG for extension-increases radicular sx ?                             Flexion causes increased numbess.  ?                             Cervical SB and rotation B x 5 with                 decreased rt rotation,, punch down on RT with red theraband.   ?Trapezius stretch B x 5, ulnar flossing and stretch both x 3 reps  ?Manual ?STM supine position with LE elevated to decrease mm tightness.  cervical traction  5 x 30" ?   10/16/21: ?   Seated: ?    Cervical retraction with over pressure ?    Cervical retraction with over pressure ?    Scapular retraction ?    Wback ?    3D cervical and thoracic excursion 5x each pain free range ?  Pec stretch 3x 30" ? ?  STM supine position with LE elevated, cervical traction 2x 30" ? ?   10/10/21: ?Review of HEP and goals ?Seated cervical retractions x 10 ?Supine cervical retractions x  10 ?Supine cervical retractions with overpressure x 10 ?Supine scap retractions x 10 ?Manual STW and cervical traction x 15 min; no other treatment performed during manual treatment. ? ?  ?PATIENT EDUCATION:  ?Education details: HEP, POC, cervical anatomy, ergonomics, and posture control. ?Person educated: Patient ?Education method: Explanation, Demonstration, and Handouts ?Education comprehension: verbalized understanding and returned demonstration ?  ?  ?HOME EXERCISE PROGRAM: ?10/10/21 ?Access Code: [email or web address] ?URL: [email or web address] ?Date: 10/10/2021 ?Prepared by: [facility] ? ?Exercises ?- Seated Cervical Retraction  - 1 x daily - 7 x weekly - 1 sets - 10 reps ?- Supine Chin Tuck  - 5 x daily - 7 x weekly - 1 sets - 10 reps ?- Supine Passive Cervical Retraction  - 5 x daily - 7 x weekly - 1 sets - 10 reps ?- Supine Scapular Retraction  - 3 x daily - 7 x weekly - 1 sets - 10 reps ? ?Eval: ?Thoracic Extension Mobilization on Foam Roll - 3-4 x daily - 7 x weekly - 2 sets - 10 reps - 2s hold ?Supine Suboccipital Release with Tennis Balls - 3-4 x daily - 7 x weekly - 2 reps - 2min hold ?Standing Shoulder Diagonal Horizontal Abduction 60/120 Degrees with Resistance - 2-3 x daily - 7 x weekly - 3 sets - 12 reps ?  ?ASSESSMENT: ?  ?CLINICAL IMPRESSION: ? Attempted to add SNAG extension but this increased radicular sx, therefore trial of flexion but his caused increased numbness therefore treatment should focus on cervical stability.   ?  ?  ?OBJECTIVE IMPAIRMENTS decreased activity tolerance, decreased strength, hypomobility, postural dysfunction, and pain.  ?  ?ACTIVITY LIMITATIONS cleaning, community activity, driving, meal prep, occupation, laundry, yard work, and yard work.  ?  ?PERSONAL FACTORS Age are also affecting patient's functional outcome.  ?  ?  ?REHAB POTENTIAL: Good ?  ?CLINICAL DECISION MAKING: Stable/uncomplicated ?  ?EVALUATION COMPLEXITY: Low ?  ?  ?GOALS: ?Goals reviewed with patient?  No ?  ?LONG TERM GOALS: Target date: 11/06/2021 ?  ?Patient will achieve pain free cervical AROM in all 6 directions. ?Baseline: Restricted with Rt rotation and SB ?Goal status: INITIAL ?  ?2.  Patient will be

## 2021-10-30 ENCOUNTER — Ambulatory Visit (HOSPITAL_COMMUNITY): Payer: BC Managed Care – PPO

## 2021-10-30 DIAGNOSIS — R29898 Other symptoms and signs involving the musculoskeletal system: Secondary | ICD-10-CM

## 2021-10-30 DIAGNOSIS — Z789 Other specified health status: Secondary | ICD-10-CM | POA: Diagnosis not present

## 2021-10-30 NOTE — Therapy (Signed)
OUTPATIENT PHYSICAL THERAPY TREATMENT NOTE   Patient Name: Alicia Garcia MRN: [id number] DOB:08-07-1960, 61 y.o., female Today's Date: 10/30/2021  PCP: Shirlean Mylar, MD REFERRING PROVIDER: Carolin Coy, MD   PT End of Session - 10/30/21 [phone]     Visit Number 5    Number of Visits 6    Date for PT Re-Evaluation 11/06/21    Authorization Type [employer] Comm PPO    Authorization Time Period 07/14/2021 - 07/13/2022    Authorization - Visit Number 3    Authorization - Number of Visits 30    Progress Note Due on Visit 10    PT Start Time 0830    PT Stop Time 0903    PT Time Calculation (min) 33 min    Activity Tolerance Patient tolerated treatment well    Behavior During Therapy [facility] for tasks assessed/performed               Past Medical History:  Diagnosis Date   Anemia    Heart murmur    at birth, it closed up   Lupus - trait only    Thalassemia trait    causes her anemia   Past Surgical History:  Procedure Laterality Date   COLONOSCOPY     2012, normal    TONSILLECTOMY     TUBAL LIGATION     Patient Active Problem List   Diagnosis Date Noted   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  She reports she feels the increased stress in her job and her husbands health issues have attributed to her neck tightness. Her husband is having surgery later today on his ankle. She arrives 13 min late for appointment this morning.    PAIN:  Are you having pain? Yes: NPRS scale:  3/10 Pain location: neck and Right arm down to middle of upper arm Pain description: burning Aggravating factors: carrying something heavy Relieving factors: lying on Right side   PCP: Shirlean Mylar, MD   REFERRING PROVIDER: Carolin Coy, MD   REFERRING DIAG: Radiculopathy, cervical region   THERAPY DIAG:  Deficit in activities of daily living (ADL)   Other symptoms and signs involving the musculoskeletal system   Right arm weakness  Italicized information from evaluation.   ONSET DATE: 08/28/2021       PERTINENT HISTORY:  Rt shoulder about 5 years ago for which she went to therapy.   PAIN:  Are you having pain? Yes: NPRS scale: 7/10 Pain location: Rt arm(distance of radiation varies and can go all the way down to the knuckles) Pain description: Burning Aggravating factors: Lying on her Lt side and extended periods of desk work. Relieving factors: Lying on her Rt side   PRECAUTIONS: None   WEIGHT BEARING RESTRICTIONS No   FALLS:  Has patient fallen in last 6 months? No Number of falls: N/A   LIVING ENVIRONMENT: Lives with: lives with their spouse Lives in: House/apartment   OCCUPATION: Desk Job   PLOF: Independent   PATIENT GOALS Be able to sleep through the night without waking up to pain  and completing a full days work without an onset of pain.   OBJECTIVE:    DIAGNOSTIC FINDINGS:  MRI of cervical spine from 3 years prior indicates, "MRI cervical spine (with and without) demonstrating: - Minimal degenerative changes as above. No spinal stenosis or foraminal narrowing. - No intrinsic, compressive or abnormal enhancing spinal cord lesions."   PATIENT SURVEYS:  FOTO 54%(68% predicted by #10)     COGNITION: Overall cognitive status: Within functional limits for tasks assessed     SENSATION: Denies sensation changes   POSTURE:  Forward shoulders   PALPATION: TTP over the Rt UT     CERVICAL ROM:    Active ROM A/PROM (deg) 10/02/2021   Flexion 34  Extension 51  Right lateral flexion 28(Rt cervical soreness)  Left lateral flexion 32  Right rotation 62(Rt cervical soreness)  Left rotation 63   (Blank rows = not tested)   UE ROM:   Active ROM Right 10/02/2021 Left 10/02/2021  Shoulder flexion      Shoulder extension      Shoulder abduction      Shoulder adduction      Shoulder extension      Shoulder internal rotation      Shoulder external rotation      Elbow flexion      Elbow extension      Wrist flexion      Wrist extension      Wrist ulnar deviation      Wrist radial deviation      Wrist pronation      Wrist supination      Grip Strength(position 2)       (Blank rows = not tested)   UE MMT:   MMT Right 10/02/2021 Left 10/02/2021  Shoulder flexion 4(previous shoulder injury) 4+  Shoulder extension 4+ 5  Shoulder abduction 4-(previous shoulder injury) 5  Shoulder adduction      Shoulder extension      Shoulder internal rotation      Shoulder external rotation      Middle trapezius      Lower trapezius      Elbow flexion 5 5  Elbow extension 4+ 5  Wrist flexion 4 5  Wrist extension 5 5  Wrist ulnar deviation      Wrist radial deviation      Wrist pronation      Wrist supination      Grip strength 50.5lbs 57.5lbs   (Blank rows = not tested)   CERVICAL SPECIAL TESTS:  Spurling's test: Negative and Distraction test: Positive   TODAY'S TREATMENT:  10/30/21 Supine: Cervical retractions x 10 Cervical retractions with overpressure x 10 Scapular retractions x 10  STM supine position with 12' cervical spine and Right upper trap manual cervical traction  10 x 10" all manual interventions performed independently of other interventions.        10/23/21:  supine:  cervical and scapular retraction x 10              Sitting   :  SNAG for extension-increases radicular sx                              Flexion causes increased numbess.                               Cervical SB and rotation B x  5 with  decreased rt rotation,, punch down on RT with red theraband.   Trapezius stretch B x 5, ulnar flossing and stretch both x 3 reps  Manual STM supine position with LE elevated to decrease mm tightness.  cervical traction  5 x 30"    10/16/21:    Seated:     Cervical retraction with over pressure     Cervical retraction with over pressure     Scapular retraction     Wback     3D cervical and thoracic excursion 5x each pain free range     Pec stretch 3x 30"    STM supine position with LE elevated, cervical traction 2x 30"       PATIENT EDUCATION:  Education details: HEP, postural education, stress management; benefit of massage Person educated: Patient Education method: Explanation Education comprehension: verbalized understanding and returned demonstration   Access Code: [email or web address] Exercises - Seated Cervical Retraction  - 1 x daily - 7 x weekly - 1 sets - 10 reps - Supine Chin Tuck  - 5 x daily - 7 x weekly - 1 sets - 10 reps - Supine Passive Cervical Retraction  - 5 x daily - 7 x weekly - 1 sets - 10 reps - Supine Scapular Retraction  - 3 x daily - 7 x weekly - 1 sets - 10 reps  Eval: Thoracic Extension Mobilization on Foam Roll - 3-4 x daily - 7 x weekly - 2 sets - 10 reps - 2s hold Supine Suboccipital Release with Tennis Balls - 3-4 x daily - 7 x weekly - 2 reps - hold Standing Shoulder Diagonal Horizontal Abduction 60/120 Degrees with Resistance - 2-3 x daily - 7 x weekly - 3 sets - 12 reps   ASSESSMENT:   CLINICAL IMPRESSION: Today's treatment continued to work on cervical stability and strengthening postural musculature. Review of HEP, good posturing. Discussed with patient  stress management as she is dealing with a stressful job and her husbands  health issues; the benefit of massage and importance of HEP. She continues with tightness of the Right upper trap; slight forward head that she corrects with verbal cueing. Patient with decreased pain report  after treatment today.  Will plan to discharge after next visit to [facility] if patient's symptoms remain controlled.      OBJECTIVE IMPAIRMENTS decreased activity tolerance, decreased strength, hypomobility, postural dysfunction, and pain.    ACTIVITY LIMITATIONS cleaning, community activity, driving, meal prep, occupation, laundry, yard work, and yard work.    PERSONAL FACTORS Age are also affecting patient's functional outcome.      REHAB POTENTIAL: Good   CLINICAL DECISION MAKING: Stable/uncomplicated   EVALUATION COMPLEXITY: Low     GOALS: Goals reviewed with patient? No   LONG TERM GOALS: Target date: 11/06/2021   Patient will achieve pain free cervical AROM in all 6 directions. Baseline: Restricted with Rt rotation and SB Goal status: INITIAL   2.  Patient will be able to complete a full work day without any radiation of pain down into the RUE that cannot be relieved with a brief repositioning of posture. Baseline: Frequent pain during work. Goal status: INITIAL   3.  Patient will have a Rt grip strength at least equal to that of the Lt hand. Baseline: 50.5lbs Goal status: INITIAL   4.  Patient will be able to reach Rt rotation cervical end ROM without an onset of pain greater than 1/10. Baseline: 62 Goal status: INITIAL   5.  Patient  will be able to reach Rt cervical side bending end ROM without an onset of pain greater than 1/10. Baseline: 28 Goal status: INITIAL   6.  Patient will be independent with her end HEP. Baseline: N/A Goal status: INITIAL     PLAN: PT FREQUENCY: 1x/week   PT DURATION: other: 5 weeks   PLANNED INTERVENTIONS: Therapeutic exercises, Therapeutic activity, Neuromuscular re-education, Balance training, Gait training, Patient/Family education, Joint mobilization, Spinal manipulation, Spinal mobilization, and Manual therapy   PLAN FOR NEXT SESSION:   plan to discharge if patient's symptoms are managed.    9:12 AM, 10/30/21 Dacia Capers Small  Akhilesh Sassone MPT [facility] [phone]

## 2021-11-06 ENCOUNTER — Ambulatory Visit (HOSPITAL_COMMUNITY): Payer: BC Managed Care – PPO

## 2021-11-06 DIAGNOSIS — R29898 Other symptoms and signs involving the musculoskeletal system: Secondary | ICD-10-CM

## 2021-11-06 DIAGNOSIS — Z789 Other specified health status: Secondary | ICD-10-CM | POA: Diagnosis not present

## 2021-11-06 NOTE — Therapy (Signed)
OUTPATIENT PHYSICAL THERAPY TREATMENT NOTE PHYSICAL THERAPY DISCHARGE SUMMARY  Visits from Start of Care: 6  Current functional level related to goals / functional outcomes: See below   Remaining deficits: See below   Education / Equipment: HEP, postural education, massage benefit   Patient agrees to discharge. Patient goals were met. Patient is being discharged due to meeting the stated rehab goals.    Patient Name: Alicia Garcia MRN: [id number] DOB:1961/05/28, 61 y.o., female Today's Date: 11/06/2021  PCP: Shirlean Mylar, MD REFERRING PROVIDER: Carolin Coy, MD   PT End of Session - 11/06/21 [id number]     Visit Number 6    Number of Visits 6    Date for PT Re-Evaluation 11/06/21    Authorization Type [employer] P[employer]    Authorization Time Period 07/14/2021 - 07/13/2022    Authorization - Visit Number 4    Authorization - Number of Visits 30    Progress Note Due on Visit 10    PT Start Time 0822    PT Stop Time 0906    PT Time Calculation (min) 44 min    Activity Tolerance Patient tolerated treatment well    Behavior During Therapy [facility] for tasks assessed/performed                Past Medical History:  Diagnosis Date   Anemia    Heart murmur    at birth, it closed up   Lupus - trait only    Thalassemia trait    causes her anemia   Past Surgical History:  Procedure Laterality Date   COLONOSCOPY     2012, normal    TONSILLECTOMY     TUBAL LIGATION     Patient Active Problem List   Diagnosis Date Noted   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  PAIN:  Are you having pain? Yes: NPRS scale: 4/10 Pain location: neck and Right arm down to middle of upper arm Pain description: burning Aggravating factors: carrying something heavy Relieving factors: lying on Right side   PCP: Shirlean Mylar, MD   REFERRING PROVIDER: Carolin Coy, MD   REFERRING DIAG: Radiculopathy, cervical region   THERAPY DIAG:  Deficit in activities of daily living (ADL)   Other symptoms and signs involving the musculoskeletal system   Right arm weakness  Italicized information from evaluation.   ONSET DATE: 08/28/2021       PERTINENT HISTORY:  Rt shoulder about 5 years ago for which she went to therapy.   PAIN:  Are you having pain? Yes: NPRS scale: 7/10 Pain location: Rt arm(distance of radiation varies and can go all the way down to the knuckles) Pain description: Burning Aggravating factors: Lying on her Lt side and extended periods of desk work. Relieving factors: Lying on her Rt side   PRECAUTIONS: None   WEIGHT BEARING RESTRICTIONS No   FALLS:  Has patient fallen in last 6 months? No Number of falls: N/A   LIVING ENVIRONMENT: Lives with: lives with their spouse Lives in: House/apartment   OCCUPATION: Desk Job   PLOF: Independent  PATIENT GOALS Be able to sleep through the night without waking up to pain and completing a full days work without an onset of pain.   OBJECTIVE:    DIAGNOSTIC FINDINGS:  MRI of cervical spine from 3 years prior indicates, "MRI cervical spine (with and without) demonstrating: - Minimal degenerative changes as above. No spinal stenosis or foraminal narrowing. - No intrinsic, compressive or abnormal enhancing spinal cord lesions."   PATIENT SURVEYS:  FOTO 54%(68% predicted by #10)     COGNITION: Overall cognitive status: Within functional limits for tasks assessed     SENSATION: Denies sensation changes   POSTURE:   Forward shoulders   PALPATION: TTP over the Rt UT     CERVICAL ROM:   11/06/21  All wfl; mild tightness noted with Right rotation and sidebending   Active ROM A/PROM (deg) 10/02/2021  Flexion 34  Extension 51  Right lateral flexion 28(Rt cervical soreness)  Left lateral flexion 32  Right rotation 62(Rt cervical soreness)  Left rotation 63   (Blank rows = not tested)   UE ROM:   Active ROM Right 10/02/2021 Left 10/02/2021  Shoulder flexion      Shoulder extension      Shoulder abduction      Shoulder adduction      Shoulder extension      Shoulder internal rotation      Shoulder external rotation      Elbow flexion      Elbow extension      Wrist flexion      Wrist extension      Wrist ulnar deviation      Wrist radial deviation      Wrist pronation      Wrist supination      Grip Strength(position 2)       (Blank rows = not tested)   UE MMT:   MMT Right 10/02/2021 Left 10/02/2021 Right 11/06/21  Shoulder flexion 4(previous shoulder injury) 4+ 4+  Shoulder extension 4+ 5   Shoulder abduction 4-(previous shoulder injury) 5 5  Shoulder adduction       Shoulder extension       Shoulder internal rotation       Shoulder external rotation       Middle trapezius       Lower trapezius       Elbow flexion 5 5   Elbow extension 4+ 5 5  Wrist flexion 4 5 5   Wrist extension 5 5   Wrist ulnar deviation       Wrist radial deviation       Wrist pronation       Wrist supination       Grip strength 50.5lbs 57.5lbs 58 lbs   (Blank rows = not tested)   CERVICAL SPECIAL TESTS:  Spurling's test: Negative and Distraction test: Positive   TODAY'S TREATMENT:  11/06/21: Review of goals and HEP Manual STW and traction x 14' in supine; all manual interventions performed independently of other interventions.     10/30/21 Supine: Cervical retractions x 10 Cervical retractions with overpressure x 10 Scapular retractions x 10  STM supine position with 12' cervical spine  and Right upper trap manual cervical traction  10 x 10" all manual interventions performed independently of other interventions.        10/23/21:  supine:  cervical and scapular retraction x 10              Sitting   :  SNAG for extension-increases  radicular sx                              Flexion causes increased numbess.                               Cervical SB and rotation B x 5 with                 decreased rt rotation,, punch down on RT with red theraband.   Trapezius stretch B x 5, ulnar flossing and stretch both x 3 reps  Manual STM supine position with LE elevated to decrease mm tightness.  cervical traction  5 x 30"    10/16/21:    Seated:     Cervical retraction with over pressure     Cervical retraction with over pressure     Scapular retraction     Wback     3D cervical and thoracic excursion 5x each pain free range     Pec stretch 3x 30"    STM supine position with LE elevated, cervical traction 2x 30"       PATIENT EDUCATION:  Education details: HEP, postural education, stress management; benefit of massage Person educated: Patient Education method: Explanation Education comprehension: verbalized understanding and returned demonstration   Access Code: [email or web address] Exercises - Seated Cervical Retraction  - 1 x daily - 7 x weekly - 1 sets - 10 reps - Supine Chin Tuck  - 5 x daily - 7 x weekly - 1 sets - 10 reps - Supine Passive Cervical Retraction  - 5 x daily - 7 x weekly - 1 sets - 10 reps - Supine Scapular Retraction  - 3 x daily - 7 x weekly - 1 sets - 10 reps  Eval: Thoracic Extension Mobilization on Foam Roll - 3-4 x daily - 7 x weekly - 2 sets - 10 reps - 2s hold Supine Suboccipital Release with Tennis Balls - 3-4 x daily - 7 x weekly - 2 reps - hold Standing Shoulder Diagonal Horizontal Abduction 60/120 Degrees with Resistance - 2-3 x daily - 7 x weekly - 3 sets - 12 reps   ASSESSMENT:   CLINICAL IMPRESSION: Patient has met all set rehab goals;  discharge today to independent HEP. Advised on importance of posturing, HEP, and how to contact us for further services if needed.      OBJECTIVE IMPAIRMENTS decreased activity tolerance, decreased strength, hypomobility, postural dysfunction, and pain.    ACTIVITY LIMITATIONS cleaning, community activity, driving, meal prep, occupation, laundry, yard work, and yard work.    PERSONAL FACTORS Age are also affecting patient's functional outcome.      REHAB POTENTIAL: Good   CLINICAL DECISION MAKING: Stable/uncomplicated   EVALUATION COMPLEXITY: Low     GOALS: Goals reviewed with patient? No   LONG TERM GOALS: Target date: 11/06/2021   Patient will achieve pain free cervical AROM in all 6 directions. Baseline: Restricted with Rt rotation and SB Goal status: met 2.  Patient will be able to complete a full work day without any radiation of pain down into the RUE that cannot be relieved with a brief repositioning of posture. Baseline: Frequent pain during work. Goal status: met 3.  Patient will have a Rt grip strength at least equal to that of the Lt hand. Baseline: 50.5lbs,  58# today R side  Goal status: met  4.  Patient will be able to reach Rt rotation cervical end ROM without an onset of pain greater than 1/10. Baseline: 62 Goal status: met   5.  Patient will be able to reach Rt cervical side bending end ROM without an onset of pain greater than 1/10. Baseline: 28 Goal status:met  6.  Patient will be independent with her end HEP. Baseline: N/A Goal status: met     PLAN: PT FREQUENCY: 1x/week   PT DURATION: other: 5 weeks   PLANNED INTERVENTIONS: Therapeutic exercises, Therapeutic activity, Neuromuscular re-education, Balance training, Gait training, Patient/Family education, Joint mobilization, Spinal manipulation, Spinal mobilization, and Manual therapy   PLAN FOR NEXT SESSION:   plan to discharge if patient's symptoms are managed.    9:15 AM, 11/06/21 Zamere Pasternak Small  Jamica Woodyard MPT [facility] [place] [phone]

## 2022-01-30 NOTE — Progress Notes (Deleted)
Office Visit Note  Patient: Alicia Garcia             Date of Birth: [birth year]           MRN: [id number]             PCP: Shirlean Mylar, MD Referring: Shirlean Mylar, MD Visit Date: 02/13/2022 Occupation: @GUAROCC @  Subjective:  No chief complaint on file.   History of Present Illness: Alicia Garcia is a 61 y.o. female ***   Activities of Daily Living:  Patient reports morning stiffness for *** {minute/hour:19697}.   Patient {ACTIONS;DENIES/REPORTS:21021675::"Denies"} nocturnal pain.  Difficulty dressing/grooming: {ACTIONS;DENIES/REPORTS:21021675::"Denies"} Difficulty climbing stairs: {ACTIONS;DENIES/REPORTS:21021675::"Denies"} Difficulty getting out of chair: {ACTIONS;DENIES/REPORTS:21021675::"Denies"} Difficulty using hands for taps, buttons, cutlery, and/or writing: {ACTIONS;DENIES/REPORTS:21021675::"Denies"}  No Rheumatology ROS completed.   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   Thalassemia 08/13/2016   Vitamin D deficiency 06/24/2016   Systemic lupus erythematosus (HCC) 06/24/2016   Primary osteoarthritis of both knees 06/24/2016    Past Medical History:  Diagnosis Date   Anemia    Heart murmur    at birth, it closed up   Lupus - trait only    Thalassemia trait    causes her anemia    Family History  Problem Relation Age of Onset   Heart attack Mother 93   Heart attack Father 33   Hypertension Sister        HBP onset @ 60   Other Brother 24       gunshot   Stroke Maternal Grandmother 40       decsd @ 16   Heart disease Brother        13 years younger, OHS at age 67   Healthy Daughter    Healthy Daughter    Past Surgical History:  Procedure Laterality Date   COLONOSCOPY     2012, normal    TONSILLECTOMY     TUBAL LIGATION     Social History   Social History Narrative   Lives in [place] with husband and cat.   Caffeine use: none   Right handed     There is no  immunization history on file for this patient.   Objective: Vital Signs: LMP 05/26/2016 (Approximate)    Physical Exam   Musculoskeletal Exam: ***  CDAI Exam: CDAI Score: -- Patient Global: --; Provider Global: -- Swollen: --; Tender: -- Joint Exam 02/13/2022   No joint exam has been documented for this visit   There is currently no information documented on the homunculus. Go to the Rheumatology activity and complete the homunculus joint exam.  Investigation: No additional findings.  Imaging: No results found.  Recent Labs: Lab Results  Component Value Date   WBC 5.0 02/12/2021   HGB 10.2 (L) 02/12/2021   PLT 249 02/12/2021   NA 137 02/12/2021   K 4.5 02/12/2021   CL 103 02/12/2021   CO2 27 02/12/2021   GLUCOSE 79 02/12/2021   BUN 13 02/12/2021   CREATININE 0.66 02/12/2021   BILITOT 0.8 02/12/2021   ALKPHOS 81 12/09/2016   AST 18 02/12/2021   ALT 8 02/12/2021   PROT 7.8 02/12/2021   ALBUMIN 4.1 12/09/2016   CALCIUM 9.5 02/12/2021   GFRAA 112 02/14/2020    Speciality Comments: No specialty comments available.  Procedures:  No procedures performed Allergies: Patient has no known allergies.   Assessment / Plan:     Visit Diagnoses: No  diagnosis found.  Orders: No orders of the defined types were placed in this encounter.  No orders of the defined types were placed in this encounter.   Face-to-face time spent with patient was *** minutes. Greater than 50% of time was spent in counseling and coordination of care.  Follow-Up Instructions: No follow-ups on file.   Ellen Henri, CMA  Note - This record has been created using Animal nutritionist.  Chart creation errors have been sought, but may not always  have been located. Such creation errors do not reflect on  the standard of medical care.

## 2022-02-13 ENCOUNTER — Ambulatory Visit: Payer: BC Managed Care – PPO | Admitting: Rheumatology

## 2022-02-13 DIAGNOSIS — E559 Vitamin D deficiency, unspecified: Secondary | ICD-10-CM

## 2022-02-13 DIAGNOSIS — M170 Bilateral primary osteoarthritis of knee: Secondary | ICD-10-CM

## 2022-02-13 DIAGNOSIS — R760 Raised antibody titer: Secondary | ICD-10-CM

## 2022-02-13 DIAGNOSIS — Z780 Asymptomatic menopausal state: Secondary | ICD-10-CM

## 2022-02-13 DIAGNOSIS — M3213 Lung involvement in systemic lupus erythematosus: Secondary | ICD-10-CM

## 2022-02-13 DIAGNOSIS — D569 Thalassemia, unspecified: Secondary | ICD-10-CM

## 2022-03-07 NOTE — Progress Notes (Signed)
Office Visit Note  Patient: Alicia Garcia             Date of Birth: 03/30/1961           MRN: [id number]             PCP: Maurice Small, MD Referring: Maurice Small, MD Visit Date: 03/21/2022 Occupation: @GUAROCC @  Subjective:  Routine follow-up  History of Present Illness: Alicia Garcia is a 61 y.o. female with history of systemic lupus erythematosus.  She is not taking any immunosuppressive agents.  Patient denies any signs or symptoms of a systemic lupus flare since her last office visit on 02/12/2021.  Patient reports that about 6 months ago she started to experience increased neck pain as well as right-sided radiculopathy.  She went to physical therapy and also had maintenance massages which has alleviated her symptoms.  She has continued home exercises and plans on having a few more massages in hopes that her symptoms will completely resolve.  She has occasional stiffness in both hands which she attributes to typing throughout the day.  She denies any joint swelling.  She denies any other joint pain or inflammation at this time.  She denies any recent rashes, hair loss, oral or nasal ulcerations, sicca symptoms, swollen lymph nodes, shortness of breath, pleuritic chest pain, or palpitations.  Her energy level has been stable.  She denies any symptoms of Raynaud's phenomenon.   Activities of Daily Living:  Patient reports morning stiffness for 0 minutes.   Patient Denies nocturnal pain.  Difficulty dressing/grooming: Denies Difficulty climbing stairs: Denies Difficulty getting out of chair: Denies Difficulty using hands for taps, buttons, cutlery, and/or writing: Denies  Review of Systems  Constitutional:  Negative for fatigue.  HENT:  Negative for mouth sores, mouth dryness and nose dryness.   Eyes:  Negative for pain, visual disturbance and dryness.  Respiratory:  Negative for cough, hemoptysis, shortness of breath and difficulty breathing.   Cardiovascular:  Negative for chest  pain, palpitations, hypertension and swelling in legs/feet.  Gastrointestinal:  Negative for blood in stool, constipation and diarrhea.  Endocrine: Negative for increased urination.  Genitourinary:  Negative for painful urination and involuntary urination.  Musculoskeletal:  Positive for joint pain and joint pain. Negative for gait problem, joint swelling, myalgias, muscle weakness, morning stiffness, muscle tenderness and myalgias.  Skin:  Negative for color change, pallor, rash, hair loss, nodules/bumps, skin tightness, ulcers and sensitivity to sunlight.  Allergic/Immunologic: Negative for susceptible to infections.  Neurological:  Negative for dizziness, numbness, headaches and weakness.  Hematological:  Negative for swollen glands.  Psychiatric/Behavioral:  Negative for depressed mood and sleep disturbance. The patient is not nervous/anxious.     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   Thalassemia 08/13/2016   Vitamin D deficiency 06/24/2016   Systemic lupus erythematosus ([place]) 06/24/2016   Primary osteoarthritis of both knees 06/24/2016    Past Medical History:  Diagnosis Date   Anemia    Heart murmur    at birth, it closed up   Lupus - trait only    Thalassemia trait    causes her anemia    Family History  Problem Relation Age of Onset   Heart attack Mother 35   Heart attack Father 49   Hypertension Sister        HBP onset @ 30   Other Brother 67       gunshot  Stroke Maternal Grandmother 22       decsd @ 49   Heart disease Brother        22 years younger, OHS at age 33   Healthy Daughter    Healthy Daughter    Past Surgical History:  Procedure Laterality Date   COLONOSCOPY     2012, normal    TONSILLECTOMY     TUBAL LIGATION     Social History   Social History Narrative   Lives in [place] with husband and cat.   Caffeine use: none   Right handed     There is no immunization  history on file for this patient.   Objective: Vital Signs: BP 103/60 (BP Location: Left Arm, Patient Position: Sitting, Cuff Size: Normal)   Pulse (!) 51   Resp 14   Ht $R'5\' 6"'HI$  (1.676 m)   Wt 154 lb (69.9 kg)   LMP 05/26/2016 (Approximate)   BMI 24.86 kg/m    Physical Exam Vitals and nursing note reviewed.  Constitutional:      Appearance: She is well-developed.  HENT:     Head: Normocephalic and atraumatic.  Eyes:     Conjunctiva/sclera: Conjunctivae normal.  Cardiovascular:     Rate and Rhythm: Normal rate and regular rhythm.     Heart sounds: Normal heart sounds.  Pulmonary:     Effort: Pulmonary effort is normal.     Breath sounds: Normal breath sounds.  Abdominal:     General: Bowel sounds are normal.     Palpations: Abdomen is soft.  Musculoskeletal:     Cervical back: Normal range of motion.  Skin:    General: Skin is warm and dry.     Capillary Refill: Capillary refill takes less than 2 seconds.     Comments: No malar rash No digital ulcerations or signs of gangrene   Neurological:     Mental Status: She is alert and oriented to person, place, and time.  Psychiatric:        Behavior: Behavior normal.      Musculoskeletal Exam: C-spine has good range of motion with some discomfort with lateral rotation to the right.  Right trapezius muscle tension and tenderness.  Some discomfort with range of motion of the right shoulder with some tenderness upon palpation.  Left shoulder has full range of motion with no discomfort.  Elbow joints, wrist joints, MCPs, PIPs, DIPs have good range of motion with no synovitis.  Complete fist formation bilaterally.  Hip joints have good range of motion with no groin pain.  No tenderness over trochanteric bursa bilaterally.  Knee joints have good range of motion with no warmth or effusion.  Ankle joints have good range of motion with no tenderness or joint swelling.  No tenderness or synovitis of MTP joints.  No evidence of Achilles  tendinitis or plantar fasciitis.  CDAI Exam: CDAI Score: -- Patient Global: --; Provider Global: -- Swollen: --; Tender: -- Joint Exam 03/21/2022   No joint exam has been documented for this visit   There is currently no information documented on the homunculus. Go to the Rheumatology activity and complete the homunculus joint exam.  Investigation: No additional findings.  Imaging: No results found.  Recent Labs: Lab Results  Component Value Date   WBC 5.0 02/12/2021   HGB 10.2 (L) 02/12/2021   PLT 249 02/12/2021   NA 137 02/12/2021   K 4.5 02/12/2021   CL 103 02/12/2021   CO2 27 02/12/2021   GLUCOSE 79  02/12/2021   BUN 13 02/12/2021   CREATININE 0.66 02/12/2021   BILITOT 0.8 02/12/2021   ALKPHOS 81 12/09/2016   AST 18 02/12/2021   ALT 8 02/12/2021   PROT 7.8 02/12/2021   ALBUMIN 4.1 12/09/2016   CALCIUM 9.5 02/12/2021   GFRAA 112 02/14/2020    Speciality Comments: No specialty comments available.  Procedures:  No procedures performed Allergies: Patient has no known allergies.   Assessment / Plan:     Visit Diagnoses: Other systemic lupus erythematosus with lung involvement ([place]) - +ANA,+dsDNA,lowC3,+aCL IgG, h/o pleuritis: She has not had any signs or symptoms of a systemic lupus flare since her last office visit on 02/12/21.  She is not currently taking any immunosuppressive agents.  Lab work from 02/12/2021 was reviewed today in the office: No proteinuria, double-stranded and negative, complements within normal limits, ESR within normal limits, vitamin D 31, anticardiolipin IgG 21.3, beta-2 glycoprotein antibodies negative, lupus anticoagulant not detected, Smith antibody negative, ANA negative, CMP within normal limits, white blood cell count within normal limits, platelet count within normal limits, hemoglobin 10.2.  The following lab work will be updated today for further evaluation. She has no synovitis on examination today.  No Malar rash noted. No signs or  alopecia.  She has not had any oral or nasal ulcerations or sicca symptoms.  No symptoms of Raynaud's phenomenon and no digital ulcerations or signs of gangrene were noted.  No cervical lymphadenopathy.  She has not had any shortness of breath, pleuritic chest pain, or palpitations.  Her lungs were clear to auscultation on examination today.  Her energy level has been stable. She was advised to notify us if she develops signs or symptoms of a systemic lupus flare.  She will follow-up in the office in 1 year or sooner if needed.   - Plan: CBC with Differential/Platelet, COMPLETE METABOLIC PANEL WITH GFR, ANA, Anti-DNA antibody, double-stranded, C3 and C4, Sedimentation rate, Beta-2 glycoprotein antibodies, Cardiolipin antibodies, IgG, IgM, IgA, Lupus Anticoagulant Eval w/Reflex, Urinalysis, Routine w reflex microscopic  Anticardiolipin antibody positive -Anticardiolipin IgG 21.3 on 02/12/2021.  Anticardiolipin antibodies will be rechecked today.  Plan: Beta-2 glycoprotein antibodies, Cardiolipin antibodies, IgG, IgM, IgA, Lupus Anticoagulant Eval w/Reflex  Primary osteoarthritis of both knees: She has good range of motion of both knee joints on examination today.  No warmth or effusion was noted.  Vitamin D deficiency - She is taking vitamin D 5000 units daily.  Vitamin D level be checked today.  Plan: VITAMIN D 25 Hydroxy (Vit-D Deficiency, Fractures)  Osteopenia of multiple sites -DEXA 02/28/2020 The BMD measured at Femur Neck Left is 0.777 g/cm2 with a T-score of-1.9.  Order for an updated DEXA was placed today for further evaluation.  She is currently taking vitamin D 5000 units daily and will be restarting her multivitamin which has calcium.  She is also been getting calcium in her diet.  Discussed that the recommended daily dose of calcium was 1200 mg daily.  Vitamin D level and CMP were ordered today.  Plan: DG BONE DENSITY (DXA), VITAMIN D 25 Hydroxy (Vit-D Deficiency, Fractures)  Neck pain: About 6  months ago she started to experience increased neck and right shoulder pain.  She was experiencing right-sided radiculopathy.  She was referred to physical therapy which has been helpful and also had massages on a regular basis which alleviated her symptoms.  She has continued home exercises and maintenance massages which has been helpful. She has good range of motion of the C-spine on  examination today with some trapezius muscle tension and tenderness on the right side.  Thalassemia, unspecified type: History of chronic anemia-stable. CBC ordered today.   Orders: Orders Placed This Encounter  Procedures   DG BONE DENSITY (DXA)   CBC with Differential/Platelet   CMP14+EGFR   Anti-DNA antibody, double-stranded   C3 and C4   Sedimentation rate   Cardiolipin antibodies, IgG, IgM, IgA   Urinalysis, Routine w reflex microscopic   VITAMIN D 25 Hydroxy (Vit-D Deficiency, Fractures)   ANA w/Reflex   Beta-2-glycoprotein i abs, IgG/M/A   Lupus anticoagulant panel   No orders of the defined types were placed in this encounter.    Follow-Up Instructions: Return in about 1 year (around 03/22/2023) for Systemic lupus erythematosus.   Ofilia Neas, PA-C  Note - This record has been created using Dragon software.  Chart creation errors have been sought, but may not always  have been located. Such creation errors do not reflect on  the standard of medical care.

## 2022-03-21 ENCOUNTER — Ambulatory Visit: Payer: BC Managed Care – PPO | Attending: Physician Assistant | Admitting: Physician Assistant

## 2022-03-21 ENCOUNTER — Encounter: Payer: Self-pay | Admitting: Physician Assistant

## 2022-03-21 VITALS — BP 103/60 | HR 51 | Resp 14 | Ht 66.0 in | Wt 154.0 lb

## 2022-03-21 DIAGNOSIS — Z780 Asymptomatic menopausal state: Secondary | ICD-10-CM

## 2022-03-21 DIAGNOSIS — D569 Thalassemia, unspecified: Secondary | ICD-10-CM

## 2022-03-21 DIAGNOSIS — M3213 Lung involvement in systemic lupus erythematosus: Secondary | ICD-10-CM | POA: Diagnosis not present

## 2022-03-21 DIAGNOSIS — R760 Raised antibody titer: Secondary | ICD-10-CM | POA: Diagnosis not present

## 2022-03-21 DIAGNOSIS — M170 Bilateral primary osteoarthritis of knee: Secondary | ICD-10-CM

## 2022-03-21 DIAGNOSIS — M8589 Other specified disorders of bone density and structure, multiple sites: Secondary | ICD-10-CM

## 2022-03-21 DIAGNOSIS — E559 Vitamin D deficiency, unspecified: Secondary | ICD-10-CM

## 2022-03-21 DIAGNOSIS — M542 Cervicalgia: Secondary | ICD-10-CM

## 2022-03-28 ENCOUNTER — Telehealth: Payer: Self-pay | Admitting: Rheumatology

## 2022-03-28 DIAGNOSIS — M542 Cervicalgia: Secondary | ICD-10-CM

## 2022-03-28 DIAGNOSIS — M3213 Lung involvement in systemic lupus erythematosus: Secondary | ICD-10-CM

## 2022-03-28 DIAGNOSIS — D569 Thalassemia, unspecified: Secondary | ICD-10-CM

## 2022-03-28 DIAGNOSIS — E559 Vitamin D deficiency, unspecified: Secondary | ICD-10-CM

## 2022-03-28 DIAGNOSIS — R760 Raised antibody titer: Secondary | ICD-10-CM

## 2022-03-28 DIAGNOSIS — M170 Bilateral primary osteoarthritis of knee: Secondary | ICD-10-CM

## 2022-03-28 DIAGNOSIS — M8589 Other specified disorders of bone density and structure, multiple sites: Secondary | ICD-10-CM

## 2022-03-28 NOTE — Telephone Encounter (Signed)
Patient called the office requesting lab orders be sent to [facility]. Patient is going Tuesday.

## 2022-03-28 NOTE — Telephone Encounter (Signed)
Lab orders released.  

## 2022-04-01 DIAGNOSIS — R760 Raised antibody titer: Secondary | ICD-10-CM | POA: Diagnosis not present

## 2022-04-01 DIAGNOSIS — E559 Vitamin D deficiency, unspecified: Secondary | ICD-10-CM | POA: Diagnosis not present

## 2022-04-01 DIAGNOSIS — D569 Thalassemia, unspecified: Secondary | ICD-10-CM | POA: Diagnosis not present

## 2022-04-01 DIAGNOSIS — M3213 Lung involvement in systemic lupus erythematosus: Secondary | ICD-10-CM | POA: Diagnosis not present

## 2022-04-07 NOTE — Progress Notes (Signed)
Vitamin D is low.  Patient should take vitamin D 2000 units daily.  Hemoglobin is low with low MCV which indicates iron deficiency anemia.  She should take multivitamin with iron.  ANA is low titer positive which is not significant.  Double-stranded DNA negative, anticardiolipin antibodies negative beta-2 GP 1 negative, sed rate normal, complements normal, UA negative, CMP normal.  Do not indicate disease flare.  No change in treatment advised. Please forward results to her PCP.

## 2022-04-08 LAB — CBC WITH DIFFERENTIAL/PLATELET
Absolute Monocytes: 410 cells/uL (ref 200–950)
Basophils Absolute: 41 cells/uL (ref 0–200)
Basophils Relative: 1 %
Eosinophils Absolute: 70 cells/uL (ref 15–500)
Eosinophils Relative: 1.7 %
HCT: 33 % — ABNORMAL LOW (ref 35.0–45.0)
Hemoglobin: 9.9 g/dL — ABNORMAL LOW (ref 11.7–15.5)
Lymphs Abs: 1443 cells/uL (ref 850–3900)
MCH: 23.2 pg — ABNORMAL LOW (ref 27.0–33.0)
MCHC: 30 g/dL — ABNORMAL LOW (ref 32.0–36.0)
MCV: 77.5 fL — ABNORMAL LOW (ref 80.0–100.0)
MPV: 11.6 fL (ref 7.5–12.5)
Monocytes Relative: 10 %
Neutro Abs: 2136 cells/uL (ref 1500–7800)
Neutrophils Relative %: 52.1 %
Platelets: 202 10*3/uL (ref 140–400)
RBC: 4.26 10*6/uL (ref 3.80–5.10)
RDW: 13 % (ref 11.0–15.0)
Total Lymphocyte: 35.2 %
WBC: 4.1 10*3/uL (ref 3.8–10.8)

## 2022-04-08 LAB — VITAMIN D 25 HYDROXY (VIT D DEFICIENCY, FRACTURES): Vit D, 25-Hydroxy: 28 ng/mL — ABNORMAL LOW (ref 30–100)

## 2022-04-08 LAB — C3 AND C4
C3 Complement: 105 mg/dL (ref 83–193)
C4 Complement: 22 mg/dL (ref 15–57)

## 2022-04-08 LAB — COMPLETE METABOLIC PANEL WITH GFR
AG Ratio: 1.2 (calc) (ref 1.0–2.5)
ALT: 9 U/L (ref 6–29)
AST: 15 U/L (ref 10–35)
Albumin: 4.1 g/dL (ref 3.6–5.1)
Alkaline phosphatase (APISO): 67 U/L (ref 37–153)
BUN: 13 mg/dL (ref 7–25)
CO2: 28 mmol/L (ref 20–32)
Calcium: 9.3 mg/dL (ref 8.6–10.4)
Chloride: 106 mmol/L (ref 98–110)
Creat: 0.52 mg/dL (ref 0.50–1.05)
Globulin: 3.3 g/dL (calc) (ref 1.9–3.7)
Glucose, Bld: 76 mg/dL (ref 65–99)
Potassium: 3.9 mmol/L (ref 3.5–5.3)
Sodium: 140 mmol/L (ref 135–146)
Total Bilirubin: 0.7 mg/dL (ref 0.2–1.2)
Total Protein: 7.4 g/dL (ref 6.1–8.1)
eGFR: 106 mL/min/{1.73_m2} (ref >=60)

## 2022-04-08 LAB — URINALYSIS, ROUTINE W REFLEX MICROSCOPIC
Bilirubin Urine: NEGATIVE
Glucose, UA: NEGATIVE
Hgb urine dipstick: NEGATIVE
Ketones, ur: NEGATIVE
Leukocytes,Ua: NEGATIVE
Nitrite: NEGATIVE
Protein, ur: NEGATIVE
Specific Gravity, Urine: 1.029 (ref 1.001–1.035)
pH: 6 (ref 5.0–8.0)

## 2022-04-08 LAB — RFLX DRVVT CONFRIM: DRVVT CONFIRM: POSITIVE — AB

## 2022-04-08 LAB — LUPUS ANTICOAGULANT EVAL W/ REFLEX
PTT-LA Screen: 45 s — ABNORMAL HIGH (ref <=40)
dRVVT: 51 s — ABNORMAL HIGH (ref <=45)

## 2022-04-08 LAB — BETA-2 GLYCOPROTEIN ANTIBODIES
Beta-2 Glyco 1 IgA: <2 U/mL (ref <20.0)
Beta-2 Glyco 1 IgM: <2 U/mL (ref <20.0)
Beta-2 Glyco I IgG: 7.9 U/mL (ref <20.0)

## 2022-04-08 LAB — SEDIMENTATION RATE: Sed Rate: 29 mm/h (ref 0–30)

## 2022-04-08 LAB — RFX DRVVT 1:1 MIX: PT, Mixing Interp: POSITIVE — AB

## 2022-04-08 LAB — ANTI-NUCLEAR AB-TITER (ANA TITER): ANA Titer 1: 1:40 {titer} — ABNORMAL HIGH

## 2022-04-08 LAB — ANTI-DNA ANTIBODY, DOUBLE-STRANDED: ds DNA Ab: 1 IU/mL

## 2022-04-08 LAB — CARDIOLIPIN ANTIBODIES, IGG, IGM, IGA
Anticardiolipin IgA: <2 APL-U/mL (ref <20.0)
Anticardiolipin IgG: 8.5 GPL-U/mL (ref <20.0)
Anticardiolipin IgM: <2 MPL-U/mL (ref <20.0)

## 2022-04-08 LAB — RFLX HEXAGONAL PHASE CONFIRM: Hexagonal Phase Conf: NEGATIVE

## 2022-04-08 LAB — ANA: Anti Nuclear Antibody (ANA): POSITIVE — AB

## 2022-04-08 NOTE — Progress Notes (Signed)
Lupus anticoagulant is positive.  I discussed the results with the patient.  Association of lupus anticoagulant antibodies with blood clot, heart attack and stroke was discussed.  I discussed the use of enteric-coated aspirin 81 mg p.o. daily.  I also discussed use of hydroxychloroquine.  She does not want to start on hydroxychloroquine.  She will start taking aspirin on a regular basis.  Side effects of aspirin including the increased risk of gastritis send bleeding was also discussed.

## 2022-04-19 DIAGNOSIS — Z1231 Encounter for screening mammogram for malignant neoplasm of breast: Secondary | ICD-10-CM | POA: Diagnosis not present

## 2022-09-08 ENCOUNTER — Other Ambulatory Visit (HOSPITAL_COMMUNITY)
Admission: RE | Admit: 2022-09-08 | Discharge: 2022-09-08 | Disposition: A | Discharge status: Home / Self Care | Payer: BC Managed Care – PPO | Source: Ambulatory Visit | Attending: Family Medicine | Admitting: Family Medicine

## 2022-09-08 ENCOUNTER — Other Ambulatory Visit: Payer: Self-pay | Admitting: Family Medicine

## 2022-09-08 DIAGNOSIS — D649 Anemia, unspecified: Secondary | ICD-10-CM | POA: Diagnosis not present

## 2022-09-08 DIAGNOSIS — Z01419 Encounter for gynecological examination (general) (routine) without abnormal findings: Secondary | ICD-10-CM | POA: Insufficient documentation

## 2022-09-08 DIAGNOSIS — Z0000 Encounter for general adult medical examination without abnormal findings: Secondary | ICD-10-CM | POA: Diagnosis not present

## 2022-09-08 DIAGNOSIS — Z23 Encounter for immunization: Secondary | ICD-10-CM | POA: Diagnosis not present

## 2022-09-08 DIAGNOSIS — E785 Hyperlipidemia, unspecified: Secondary | ICD-10-CM | POA: Diagnosis not present

## 2022-09-10 LAB — CYTOLOGY - PAP
Adequacy: ABSENT
Comment: NEGATIVE
Diagnosis: NEGATIVE
High risk HPV: NEGATIVE

## 2022-11-03 DIAGNOSIS — M25511 Pain in right shoulder: Secondary | ICD-10-CM | POA: Diagnosis not present

## 2022-11-03 DIAGNOSIS — M25512 Pain in left shoulder: Secondary | ICD-10-CM | POA: Diagnosis not present

## 2022-11-04 DIAGNOSIS — Z1211 Encounter for screening for malignant neoplasm of colon: Secondary | ICD-10-CM | POA: Diagnosis not present

## 2022-11-12 HISTORY — PX: COLONOSCOPY: SHX174

## 2022-12-05 DIAGNOSIS — M47893 Other spondylosis, cervicothoracic region: Secondary | ICD-10-CM | POA: Diagnosis not present

## 2022-12-05 DIAGNOSIS — Z1211 Encounter for screening for malignant neoplasm of colon: Secondary | ICD-10-CM | POA: Diagnosis not present

## 2022-12-05 DIAGNOSIS — M1990 Unspecified osteoarthritis, unspecified site: Secondary | ICD-10-CM | POA: Diagnosis not present

## 2022-12-05 DIAGNOSIS — K5730 Diverticulosis of large intestine without perforation or abscess without bleeding: Secondary | ICD-10-CM | POA: Diagnosis not present

## 2023-03-17 NOTE — Progress Notes (Signed)
Office Visit Note  Patient: Alicia Garcia             Date of Birth: [birth year]           MRN: [id number]             PCP: Camie Patience, FNP Referring: Shirlean Mylar, MD Visit Date: 03/26/2023 Occupation: @GUAROCC @  Subjective:  Hair loss  History of Present Illness: Alicia Garcia is a 62 y.o. female with history of systemic lupus.  Patient states that she was braiding her hair and noticed a patch of hair loss on top of her scalp.  It is not pruritic.  She also bumped her left great toe, when she took off the nail polish she noticed some discoloration in her left great toenail.  She denies any history of oral ulcers, nasal ulcers, malar rash, photosensitivity, Raynaud's, lymphadenopathy or inflammatory arthritis.    Activities of Daily Living:  Patient reports morning stiffness for 0 minutes.   Patient Denies nocturnal pain.  Difficulty dressing/grooming: Denies Difficulty climbing stairs: Denies Difficulty getting out of chair: Denies Difficulty using hands for taps, buttons, cutlery, and/or writing: Denies  Review of Systems  Constitutional:  Negative for fatigue.  HENT:  Negative for mouth sores and mouth dryness.   Eyes:  Negative for dryness.  Respiratory:  Negative for shortness of breath.   Cardiovascular:  Negative for chest pain and palpitations.  Gastrointestinal:  Negative for blood in stool, constipation and diarrhea.  Endocrine: Negative for increased urination.  Genitourinary:  Negative for involuntary urination.  Musculoskeletal:  Negative for joint pain, gait problem, joint pain, joint swelling, myalgias, muscle weakness, morning stiffness, muscle tenderness and myalgias.  Skin:  Positive for hair loss. Negative for color change, rash and sensitivity to sunlight.  Allergic/Immunologic: Negative for susceptible to infections.  Neurological:  Positive for headaches. Negative for dizziness.  Hematological:  Negative for swollen glands.   Psychiatric/Behavioral:  Negative for depressed mood and sleep disturbance. The patient is not nervous/anxious.     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   Thalassemia 08/13/2016   Vitamin D deficiency 06/24/2016   Systemic lupus erythematosus ([facility]) 06/24/2016   Primary osteoarthritis of both knees 06/24/2016    Past Medical History:  Diagnosis Date   Anemia    Heart murmur    at birth, it closed up   Lupus - trait only    Thalassemia trait    causes her anemia    Family History  Problem Relation Age of Onset   Heart attack Mother 16   Heart attack Father 3   Hypertension Sister        HBP onset @ 57   Other Brother 24       gunshot   Heart disease Brother        13 years younger, OHS at age 65   Stroke Maternal Grandmother 72       decsd @ 30   Healthy Daughter    Healthy Daughter    Past Surgical History:  Procedure Laterality Date   COLONOSCOPY     2012, normal    COLONOSCOPY  11/2022   TONSILLECTOMY     TUBAL LIGATION     Social History   Social History Narrative   Lives in [place] with husband and cat.   Caffeine use: none   Right handed     There is no immunization history on  file for this patient.   Objective: Vital Signs: BP 114/69 (BP Location: Left Arm, Patient Position: Sitting, Cuff Size: Normal)   Pulse (!) 42   Resp 15   Ht 5' 5.75" (1.67 m)   Wt 155 lb 9.6 oz (70.6 kg)   LMP 05/26/2016 (Approximate)   BMI 25.31 kg/m    Physical Exam Vitals and nursing note reviewed.  Constitutional:      Appearance: She is well-developed.  HENT:     Head: Normocephalic and atraumatic.  Eyes:     Conjunctiva/sclera: Conjunctivae normal.  Cardiovascular:     Rate and Rhythm: Normal rate and regular rhythm.     Heart sounds: Normal heart sounds.  Pulmonary:     Effort: Pulmonary effort is normal.     Breath sounds: Normal breath sounds.  Abdominal:     General: Bowel  sounds are normal.     Palpations: Abdomen is soft.  Musculoskeletal:     Cervical back: Normal range of motion.  Lymphadenopathy:     Cervical: No cervical adenopathy.  Skin:    General: Skin is warm and dry.     Capillary Refill: Capillary refill takes less than 2 seconds.     Comments: 1 x 1 cm patchy hair loss was noted on top of her scalp.  Nail dystrophy was noted of the left great toenail.  Neurological:     Mental Status: She is alert and oriented to person, place, and time.  Psychiatric:        Behavior: Behavior normal.      Musculoskeletal Exam: Cervical, thoracic and lumbar spine were in good range of motion.  Shoulder joints, elbow joints, wrist joints, MCPs PIPs and DIPs in good range of motion with no synovitis.  Hip joints, knee joints, ankles, MTPs and PIPs were in good range of motion with no synovitis.  CDAI Exam: CDAI Score: -- Patient Global: --; Provider Global: -- Swollen: --; Tender: -- Joint Exam 03/26/2023   No joint exam has been documented for this visit   There is currently no information documented on the homunculus. Go to the Rheumatology activity and complete the homunculus joint exam.  Investigation: No additional findings.  Imaging: No results found.  Recent Labs: Lab Results  Component Value Date   WBC 4.1 04/01/2022   HGB 9.9 (L) 04/01/2022   PLT 202 04/01/2022   NA 140 04/01/2022   K 3.9 04/01/2022   CL 106 04/01/2022   CO2 28 04/01/2022   GLUCOSE 76 04/01/2022   BUN 13 04/01/2022   CREATININE 0.52 04/01/2022   BILITOT 0.7 04/01/2022   ALKPHOS 81 12/09/2016   AST 15 04/01/2022   ALT 9 04/01/2022   PROT 7.4 04/01/2022   ALBUMIN 4.1 12/09/2016   CALCIUM 9.3 04/01/2022   GFRAA 112 02/14/2020   April 01, 2022 ANA 1: 40 NS, dsDNA negative, C3-C4 normal, lupus anticoagulant positive, anticardiolipin negative, beta-2 GP 1 negative, sed rate 29, vitamin D 28  Speciality Comments: No specialty comments  available.  Procedures:  No procedures performed Allergies: Patient has no known allergies.   Assessment / Plan:     Visit Diagnoses: Other systemic lupus erythematosus with lung involvement ([facility]) - +ANA,+dsDNA,lowC3,+aCL IgG, h/o pleuritis: -Patient denies any history of oral ulcers, nasal ulcers, malar rash, photosensitivity, lymphadenopathy or inflammatory arthritis.  Labs obtained on April 01, 2022 ANA 1: 40 NS, dsDNA negative, C3-C4 normal, lupus anticoagulant positive, anticardiolipin negative, beta-2 GP 1 negative, sed rate 29, vitamin D 28.  Lab results were reviewed with the patient.  She had anticardiolipin antibodies positive in the past and the lupus anticoagulant was negative.  Will check labs today.  She is not on any immunosuppressive agents.  She is asymptomatic.  Plan: CBC with Differential/Platelet, COMPLETE METABOLIC PANEL WITH GFR, Protein / creatinine ratio, urine, Anti-DNA antibody, double-stranded, C3 and C4, Sedimentation rate, ANA, Beta-2 glycoprotein antibodies, Cardiolipin antibodies, IgG, IgM, IgA, Lupus Anticoagulant Eval w/Reflex  Anticardiolipin antibody positive - Anticardiolipin IgG 21.3 on 02/12/2021.  Anticardiolipin antibody and beta-2 GP 1 had been negative since then.  Lupus anticoagulant positive-she did not have positive lupus anticoagulant in the past but had positive lupus anticoagulant on April 01, 2022.  Will repeat lupus anticoagulant today.  She has been on enteric-coated aspirin 81 mg p.o. daily.  If her lupus anticoagulant and other antibodies are negative we will discontinue aspirin.  I did detailed discussion with patient about it.  Primary osteoarthritis of both knees-she recently has not experienced much discomfort in her knee joints.  No warmth swelling or effusion was noted.  Vitamin D deficiency -vitamin D was low at 81 on April 01, 2022.  She has been taking vitamin D 5000 units daily supplement.  Will check vitamin D level today.   Plan: VITAMIN D 25 Hydroxy (Vit-D Deficiency, Fractures)  Osteopenia of multiple sites - DEXA 02/28/2020 The BMD measured at Femur Neck Left is 0.777 g/cm2 with a T-score of-1.9.  Use of calcium rich diet and vitamin D was discussed.  Regular exercise was emphasized.  Resistive exercises were discussed.  I will ask schedule repeat DEXA scan.  Will call her when results are available.- Plan: DG Bone Density  Thalassemia, unspecified type - History of chronic anemia-stable.  Hair loss -patient recently noticed patchy hair loss on top of her scalp.  No erythema was noted.  I believe it is traction alopecia from making braids.  I will refer her to dermatology per patient's request.  Plan: Ambulatory referral to Dermatology  Nail dystrophy-nail dystrophy was noted in the left toenail.  Patient states she was wearing nail polish and then bumped her toe into some furniture.  I believe it is due to injury.   I advised her to observe for now.  Patient did review it with the dermatologist.  Orders: Orders Placed This Encounter  Procedures   DG Bone Density   CBC with Differential/Platelet   COMPLETE METABOLIC PANEL WITH GFR   Protein / creatinine ratio, urine   Anti-DNA antibody, double-stranded   C3 and C4   Sedimentation rate   ANA   Beta-2 glycoprotein antibodies   Cardiolipin antibodies, IgG, IgM, IgA   Lupus Anticoagulant Eval w/Reflex   VITAMIN D 25 Hydroxy (Vit-D Deficiency, Fractures)   Ambulatory referral to Dermatology   No orders of the defined types were placed in this encounter.    Follow-Up Instructions: Return in about 1 year (around 03/25/2024) for Systemic lupus, Osteoarthritis.   Pollyann Savoy, MD  Note - This record has been created using Animal nutritionist.  Chart creation errors have been sought, but may not always  have been located. Such creation errors do not reflect on  the standard of medical care.

## 2023-03-26 ENCOUNTER — Encounter: Payer: Self-pay | Admitting: Rheumatology

## 2023-03-26 ENCOUNTER — Ambulatory Visit: Payer: BC Managed Care – PPO | Attending: Rheumatology | Admitting: Rheumatology

## 2023-03-26 VITALS — BP 114/69 | HR 42 | Resp 15 | Ht 65.75 in | Wt 155.6 lb

## 2023-03-26 DIAGNOSIS — E559 Vitamin D deficiency, unspecified: Secondary | ICD-10-CM | POA: Diagnosis not present

## 2023-03-26 DIAGNOSIS — M3213 Lung involvement in systemic lupus erythematosus: Secondary | ICD-10-CM | POA: Diagnosis not present

## 2023-03-26 DIAGNOSIS — L603 Nail dystrophy: Secondary | ICD-10-CM

## 2023-03-26 DIAGNOSIS — M542 Cervicalgia: Secondary | ICD-10-CM

## 2023-03-26 DIAGNOSIS — M8589 Other specified disorders of bone density and structure, multiple sites: Secondary | ICD-10-CM

## 2023-03-26 DIAGNOSIS — M170 Bilateral primary osteoarthritis of knee: Secondary | ICD-10-CM

## 2023-03-26 DIAGNOSIS — R760 Raised antibody titer: Secondary | ICD-10-CM | POA: Diagnosis not present

## 2023-03-26 DIAGNOSIS — D569 Thalassemia, unspecified: Secondary | ICD-10-CM

## 2023-03-26 DIAGNOSIS — L659 Nonscarring hair loss, unspecified: Secondary | ICD-10-CM

## 2023-04-02 LAB — C3 AND C4
C3 Complement: 116 mg/dL (ref 83–193)
C4 Complement: 21 mg/dL (ref 15–57)

## 2023-04-02 LAB — COMPLETE METABOLIC PANEL WITH GFR
AG Ratio: 1.4 (calc) (ref 1.0–2.5)
ALT: 9 U/L (ref 6–29)
AST: 17 U/L (ref 10–35)
Albumin: 4.4 g/dL (ref 3.6–5.1)
Alkaline phosphatase (APISO): 77 U/L (ref 37–153)
BUN: 11 mg/dL (ref 7–25)
CO2: 28 mmol/L (ref 20–32)
Calcium: 9.5 mg/dL (ref 8.6–10.4)
Chloride: 105 mmol/L (ref 98–110)
Creat: 0.57 mg/dL (ref 0.50–1.05)
Globulin: 3.2 g/dL (calc) (ref 1.9–3.7)
Glucose, Bld: 89 mg/dL (ref 65–99)
Potassium: 4.2 mmol/L (ref 3.5–5.3)
Sodium: 138 mmol/L (ref 135–146)
Total Bilirubin: 0.8 mg/dL (ref 0.2–1.2)
Total Protein: 7.6 g/dL (ref 6.1–8.1)
eGFR: 103 mL/min/{1.73_m2} (ref >=60)

## 2023-04-02 LAB — CBC WITH DIFFERENTIAL/PLATELET
Absolute Monocytes: 409 cells/uL (ref 200–950)
Basophils Absolute: 42 cells/uL (ref 0–200)
Basophils Relative: 0.9 %
Eosinophils Absolute: 99 cells/uL (ref 15–500)
Eosinophils Relative: 2.1 %
HCT: 33.9 % — ABNORMAL LOW (ref 35.0–45.0)
Hemoglobin: 10.1 g/dL — ABNORMAL LOW (ref 11.7–15.5)
Lymphs Abs: 1828 cells/uL (ref 850–3900)
MCH: 22.9 pg — ABNORMAL LOW (ref 27.0–33.0)
MCHC: 29.8 g/dL — ABNORMAL LOW (ref 32.0–36.0)
MCV: 76.9 fL — ABNORMAL LOW (ref 80.0–100.0)
MPV: 11.8 fL (ref 7.5–12.5)
Monocytes Relative: 8.7 %
Neutro Abs: 2322 cells/uL (ref 1500–7800)
Neutrophils Relative %: 49.4 %
Platelets: 226 10*3/uL (ref 140–400)
RBC: 4.41 10*6/uL (ref 3.80–5.10)
RDW: 13 % (ref 11.0–15.0)
Total Lymphocyte: 38.9 %
WBC: 4.7 10*3/uL (ref 3.8–10.8)

## 2023-04-02 LAB — CARDIOLIPIN ANTIBODIES, IGG, IGM, IGA
Anticardiolipin IgA: <2 APL-U/mL (ref <20.0)
Anticardiolipin IgG: 8.6 GPL-U/mL (ref <20.0)
Anticardiolipin IgM: <2 MPL-U/mL (ref <20.0)

## 2023-04-02 LAB — ANTI-NUCLEAR AB-TITER (ANA TITER): ANA Titer 1: 1:80 {titer} — ABNORMAL HIGH

## 2023-04-02 LAB — PROTEIN / CREATININE RATIO, URINE
Creatinine, Urine: 111 mg/dL (ref 20–275)
Protein/Creat Ratio: 72 mg/g creat (ref 24–184)
Protein/Creatinine Ratio: 0.072 mg/mg creat (ref 0.024–0.184)
Total Protein, Urine: 8 mg/dL (ref 5–24)

## 2023-04-02 LAB — LUPUS ANTICOAGULANT EVAL W/ REFLEX
PTT-LA Screen: 39 s (ref <=40)
dRVVT: 41 s (ref <=45)

## 2023-04-02 LAB — ANA: Anti Nuclear Antibody (ANA): POSITIVE — AB

## 2023-04-02 LAB — ANTI-DNA ANTIBODY, DOUBLE-STRANDED: ds DNA Ab: <1 IU/mL

## 2023-04-02 LAB — BETA-2 GLYCOPROTEIN ANTIBODIES
Beta-2 Glyco 1 IgA: <2 U/mL (ref <20.0)
Beta-2 Glyco 1 IgM: <2 U/mL (ref <20.0)
Beta-2 Glyco I IgG: 7 U/mL (ref <20.0)

## 2023-04-02 LAB — VITAMIN D 25 HYDROXY (VIT D DEFICIENCY, FRACTURES): Vit D, 25-Hydroxy: 32 ng/mL (ref 30–100)

## 2023-04-02 LAB — SEDIMENTATION RATE: Sed Rate: 31 mm/h — ABNORMAL HIGH (ref 0–30)

## 2023-04-02 NOTE — Progress Notes (Signed)
Hemoglobin is low and stable.  MCV is low.  Patient should consider taking multivitamin with iron.  CMP normal, sed rate mildly elevated.  ANA low titer and stable.  Double-stranded DNA negative, complements normal, urine protein creatinine ratio normal, beta-2 GP 1 negative, anticardiolipin negative, lupus anticoagulant negative, vitamin D 32 low normal.  Labs do not indicate an autoimmune disease flare.  Patient should continue vitamin D supplement.

## 2023-04-03 ENCOUNTER — Ambulatory Visit (HOSPITAL_COMMUNITY)
Admission: RE | Admit: 2023-04-03 | Discharge: 2023-04-03 | Disposition: A | Discharge status: Home / Self Care | Payer: BC Managed Care – PPO | Source: Ambulatory Visit | Attending: Rheumatology | Admitting: Rheumatology

## 2023-04-03 DIAGNOSIS — M8589 Other specified disorders of bone density and structure, multiple sites: Secondary | ICD-10-CM | POA: Insufficient documentation

## 2023-04-05 NOTE — Progress Notes (Signed)
DEXA scan is consistent with osteopenia.  No significant change was noted when compared to the previous study of 2021.  Patient should continue calcium rich diet, vitamin D and resistive exercises.

## 2023-04-23 DIAGNOSIS — Z1231 Encounter for screening mammogram for malignant neoplasm of breast: Secondary | ICD-10-CM | POA: Diagnosis not present

## 2023-05-21 DIAGNOSIS — L638 Other alopecia areata: Secondary | ICD-10-CM | POA: Diagnosis not present

## 2023-09-10 DIAGNOSIS — D569 Thalassemia, unspecified: Secondary | ICD-10-CM | POA: Diagnosis not present

## 2023-09-10 DIAGNOSIS — E559 Vitamin D deficiency, unspecified: Secondary | ICD-10-CM | POA: Diagnosis not present

## 2023-09-10 DIAGNOSIS — Z0000 Encounter for general adult medical examination without abnormal findings: Secondary | ICD-10-CM | POA: Diagnosis not present

## 2023-09-10 DIAGNOSIS — E785 Hyperlipidemia, unspecified: Secondary | ICD-10-CM | POA: Diagnosis not present

## 2023-10-10 DIAGNOSIS — G43809 Other migraine, not intractable, without status migrainosus: Secondary | ICD-10-CM | POA: Diagnosis not present

## 2024-01-18 DIAGNOSIS — F4323 Adjustment disorder with mixed anxiety and depressed mood: Secondary | ICD-10-CM | POA: Diagnosis not present

## 2024-02-01 DIAGNOSIS — F4323 Adjustment disorder with mixed anxiety and depressed mood: Secondary | ICD-10-CM | POA: Diagnosis not present

## 2024-02-05 DIAGNOSIS — R55 Syncope and collapse: Secondary | ICD-10-CM | POA: Diagnosis not present

## 2024-02-08 DIAGNOSIS — R55 Syncope and collapse: Secondary | ICD-10-CM | POA: Diagnosis not present

## 2024-02-09 ENCOUNTER — Other Ambulatory Visit (HOSPITAL_COMMUNITY): Payer: Self-pay | Admitting: Family Medicine

## 2024-02-09 DIAGNOSIS — R55 Syncope and collapse: Secondary | ICD-10-CM | POA: Diagnosis not present

## 2024-02-09 DIAGNOSIS — E785 Hyperlipidemia, unspecified: Secondary | ICD-10-CM

## 2024-02-15 DIAGNOSIS — F4323 Adjustment disorder with mixed anxiety and depressed mood: Secondary | ICD-10-CM | POA: Diagnosis not present

## 2024-02-29 DIAGNOSIS — F4323 Adjustment disorder with mixed anxiety and depressed mood: Secondary | ICD-10-CM | POA: Diagnosis not present

## 2024-03-01 DIAGNOSIS — R55 Syncope and collapse: Secondary | ICD-10-CM | POA: Diagnosis not present

## 2024-03-04 ENCOUNTER — Ambulatory Visit (HOSPITAL_COMMUNITY)
Admission: RE | Admit: 2024-03-04 | Discharge: 2024-03-04 | Disposition: A | Discharge status: Home / Self Care | Payer: Self-pay | Source: Ambulatory Visit | Attending: Family Medicine | Admitting: Family Medicine

## 2024-03-04 DIAGNOSIS — E785 Hyperlipidemia, unspecified: Secondary | ICD-10-CM | POA: Insufficient documentation

## 2024-03-08 NOTE — Progress Notes (Unsigned)
 Electrophysiology Office Note:    Date:  03/09/2024   ID:  Alicia Garcia, Alicia Garcia 1961/07/08, MRN [id number]  PCP:  Dyane Anthony RAMAN, FNP   [facility] Providers Cardiologist:  None     Referring MD: Dyane Anthony RAMAN, FNP   History of Present Illness:    Alicia Garcia is a 63 y.o. female with a medical history significant for systemic lupus with anticardiolipin antibody, referred for arrhythmia management.      Discussed the use of AI scribe software for clinical note transcription with the patient, who gave verbal consent to proceed.  History of Present Illness Alicia Garcia is a 63 year old female who presents with episodes of dizziness and syncope.  On July 25th, she experienced dizziness and syncope after prolonged exposure to high temperatures while tubing. She was in direct sunlight for approximately two and a half to three hours. [place] dock, she felt dizzy, sat down briefly, and fainted after climbing stairs.  A similar episode occurred in March, prompting her to seek medical attention. She felt flushed, sat down, and then laid down due to feeling unwell. Prior to March, she had not experienced such symptoms for decades.  She has a significant family history of heart disease, with her biological mother, father, and adopted father all having died from heart attacks. She denies chest pain, palpitations, or additional episodes of lightheadedness or dizziness outside of the two mentioned incidents.         Today, she reports she feels well and has no complaints.  EKGs/Labs/Other Studies Reviewed Today:     Echocardiogram:  TTE - ordered Will follow-up on results   Monitors:  13 day monitor July 2025-- my interpretation Sinus rhythm 35-124 beats minute, average 53 bpm Ectopy was rare. No symptoms were reported. Sinus bradycardia was noted during sleep, alternating with atrial runs. There were brief atrial runs up to 6.5 seconds in  duration.  These appear to be most consistent with atrial runs.    EKG:   EKG Interpretation Date/Time:  Wednesday March 09 2024 13:46:53 EDT Ventricular Rate:  45 PR Interval:  156 QRS Duration:  94 QT Interval:  442 QTC Calculation: 382 R Axis:   46  Text Interpretation: Sinus bradycardia When compared with ECG of 01-Feb-2018 09:23, No significant change Confirmed by Nancey Scotts [phone]) on 03/09/2024 1:58:42 PM     Physical Exam:    VS:  BP 118/64 (BP Location: Right Arm, Patient Position: Sitting, Cuff Size: Normal)   Pulse (!) 45   Ht 5' 5.75 (1.67 m)   Wt 153 lb (69.4 kg)   LMP 05/26/2016 (Approximate)   SpO2 99%   BMI 24.88 kg/m     Wt Readings from Last 3 Encounters:  03/09/24 153 lb (69.4 kg)  03/26/23 155 lb 9.6 oz (70.6 kg)  03/21/22 154 lb (69.9 kg)     GEN: Well nourished, well developed in no acute distress CARDIAC: RRR, no murmurs, rubs, gallops RESPIRATORY:  Normal work of breathing MUSCULOSKELETAL: no edema    ASSESSMENT & PLAN:     Brief atrial runs Asymptomatic No intervention required at this time Will ensure her heart is structurally and functionally normal with TTE Will follow-up as needed pending results of TTE. If echo is normal, we will discharge her to for follow-up with her primary care physician  Syncope Syndrome is most consistent with vagal and orthostasis    Signed, Scotts FORBES Nancey, MD  03/09/2024 2:06 PM  [facility]

## 2024-03-09 ENCOUNTER — Ambulatory Visit: Attending: Cardiovascular Disease | Admitting: Cardiovascular Disease

## 2024-03-09 VITALS — BP 118/64 | HR 45 | Ht 65.75 in | Wt 153.0 lb

## 2024-03-09 DIAGNOSIS — R55 Syncope and collapse: Secondary | ICD-10-CM

## 2024-03-09 DIAGNOSIS — I471 Supraventricular tachycardia, unspecified: Secondary | ICD-10-CM

## 2024-03-09 NOTE — Patient Instructions (Signed)
 Medication Instructions:  Your physician recommends that you continue on your current medications as directed. Please refer to the Current Medication list given to you today.  *If you need a refill on your cardiac medications before your next appointment, please call your pharmacy*  Lab Work: None ordered.  If you have labs (blood work) drawn today and your tests are completely normal, you will receive your results only by: MyChart Message (if you have MyChart) OR A paper copy in the mail If you have any lab test that is abnormal or we need to change your treatment, we will call you to review the results.  Testing/Procedures: Your physician has requested that you have an echocardiogram. Echocardiography is a painless test that uses sound waves to create images of your heart. It provides your doctor with information about the size and shape of your heart and how well your heart's chambers and valves are working. This procedure takes approximately one hour. There are no restrictions for this procedure. Please do NOT wear cologne, perfume, aftershave, or lotions (deodorant is allowed). Please arrive 15 minutes prior to your appointment time.  Please note: We ask at that you not bring children with you during ultrasound (echo/ vascular) testing. Due to room size and safety concerns, children are not allowed in the ultrasound rooms during exams. Our front office staff cannot provide observation of children in our lobby area while testing is being conducted. An adult accompanying a patient to their appointment will only be allowed in the ultrasound room at the discretion of the ultrasound technician under special circumstances. We apologize for any inconvenience.   Follow-Up: At [facility], you and your health needs are our priority.  As part of our continuing mission to provide you with exceptional heart care, our providers are all part of one team.  This team includes your primary  Cardiologist (physician) and Advanced Practice Providers or APPs (Physician Assistants and Nurse Practitioners) who all work together to provide you with the care you need, when you need it.  Your next appointment:   Follow up will be based on echo results

## 2024-03-10 DIAGNOSIS — F4323 Adjustment disorder with mixed anxiety and depressed mood: Secondary | ICD-10-CM | POA: Diagnosis not present

## 2024-03-21 DIAGNOSIS — F4323 Adjustment disorder with mixed anxiety and depressed mood: Secondary | ICD-10-CM | POA: Diagnosis not present

## 2024-03-24 ENCOUNTER — Ambulatory Visit: Payer: BC Managed Care – PPO | Admitting: Rheumatology

## 2024-03-24 DIAGNOSIS — E559 Vitamin D deficiency, unspecified: Secondary | ICD-10-CM

## 2024-03-24 DIAGNOSIS — M3213 Lung involvement in systemic lupus erythematosus: Secondary | ICD-10-CM

## 2024-03-24 DIAGNOSIS — M170 Bilateral primary osteoarthritis of knee: Secondary | ICD-10-CM

## 2024-03-24 DIAGNOSIS — D569 Thalassemia, unspecified: Secondary | ICD-10-CM

## 2024-03-24 DIAGNOSIS — M8589 Other specified disorders of bone density and structure, multiple sites: Secondary | ICD-10-CM

## 2024-03-24 DIAGNOSIS — R760 Raised antibody titer: Secondary | ICD-10-CM

## 2024-03-24 DIAGNOSIS — L659 Nonscarring hair loss, unspecified: Secondary | ICD-10-CM

## 2024-03-24 DIAGNOSIS — L603 Nail dystrophy: Secondary | ICD-10-CM

## 2024-04-15 ENCOUNTER — Ambulatory Visit (HOSPITAL_COMMUNITY)

## 2024-04-28 ENCOUNTER — Ambulatory Visit: Admitting: Rheumatology

## 2024-05-20 ENCOUNTER — Ambulatory Visit (HOSPITAL_COMMUNITY)
Admission: RE | Admit: 2024-05-20 | Discharge: 2024-05-20 | Disposition: A | Discharge status: Home / Self Care | Source: Ambulatory Visit | Attending: Cardiovascular Disease | Admitting: Cardiovascular Disease

## 2024-05-20 DIAGNOSIS — I471 Supraventricular tachycardia, unspecified: Secondary | ICD-10-CM

## 2024-05-20 DIAGNOSIS — R55 Syncope and collapse: Secondary | ICD-10-CM | POA: Diagnosis not present

## 2024-05-21 LAB — ECHOCARDIOGRAM COMPLETE
Area-P 1/2: 3.24 cm2
MV M vel: 5.93 m/s
MV Peak grad: 140.7 mmHg
Radius: 0.5 cm
S' Lateral: 2.9 cm

## 2024-05-25 ENCOUNTER — Ambulatory Visit: Payer: Self-pay | Admitting: Cardiovascular Disease

## 2024-05-27 DIAGNOSIS — Z1231 Encounter for screening mammogram for malignant neoplasm of breast: Secondary | ICD-10-CM | POA: Diagnosis not present

## 2024-08-03 NOTE — Progress Notes (Signed)
 "  Office Visit Note  Patient: Alicia Garcia             Date of Birth: 10-15-1960           MRN: [id number]             PCP: Dyane Anthony RAMAN, FNP Referring: Dyane Anthony RAMAN, FNP Visit Date: 08/17/2024 Occupation: Data Unavailable  Subjective:  Positive ANA  History of Present Illness: Alicia Garcia is a 64 y.o. female with history of systemic lupus.  She returns today after her last visit in September 2024.  She denies any history of fatigue, oral ulcers, nasal ulcers, sicca symptoms, malar rash, photosensitivity, Raynaud's, inflammatory arthritis or lymphadenopathy.  She states last summer she [place] on [place].  She states she passed out and had extensive workup which was negative.  She also had an echocardiogram for evaluation.  She denies any joint pain today.    Activities of Daily Living:  Patient reports morning stiffness for 0 minutes.   Patient Denies nocturnal pain.  Difficulty dressing/grooming: Denies Difficulty climbing stairs: Denies Difficulty getting out of chair: Denies Difficulty using hands for taps, buttons, cutlery, and/or writing: Denies  Review of Systems  Constitutional:  Negative for fatigue.  HENT:  Negative for mouth sores and mouth dryness.   Eyes:  Negative for dryness.  Respiratory:  Negative for shortness of breath.   Cardiovascular:  Negative for chest pain and palpitations.  Gastrointestinal:  Negative for blood in stool, constipation and diarrhea.  Endocrine: Negative for increased urination.  Genitourinary:  Negative for involuntary urination.  Musculoskeletal:  Negative for joint pain, gait problem, joint pain, joint swelling, myalgias, muscle weakness, morning stiffness, muscle tenderness and myalgias.  Skin:  Negative for color change, rash, hair loss and sensitivity to sunlight.  Allergic/Immunologic: Negative for susceptible to infections.  Neurological:  Positive for headaches. Negative for dizziness.   Hematological:  Negative for swollen glands.  Psychiatric/Behavioral:  Negative for depressed mood and sleep disturbance. The patient is not nervous/anxious.     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   Thalassemia 08/13/2016   Vitamin D  deficiency 06/24/2016   Systemic lupus erythematosus (HCC) 06/24/2016   Primary osteoarthritis of both knees 06/24/2016    Past Medical History:  Diagnosis Date   Anemia    Family history of coronary artery disease    Heart murmur    at birth, it closed up   HLD (hyperlipidemia)    Lupus - trait only    Osteoarthritis of knee    Osteopenia    Thalassemia    Thalassemia trait    causes her anemia    Family History  Problem Relation Age of Onset   Heart attack Mother 10   Heart attack Father 68   Hypertension Sister        HBP onset @ 85   Other Brother 24       gunshot   Heart disease Brother        13 years younger, OHS at age 37   Stroke Maternal Grandmother 42       decsd @ 79   Healthy Daughter    Healthy Daughter    Past Surgical History:  Procedure Laterality Date   COLONOSCOPY     2012, normal    COLONOSCOPY  11/2022   TONSILLECTOMY     TUBAL LIGATION  Social History[1] Social History   Social History Narrative   Lives in [place] with husband and cat.   Caffeine use: none   Right handed       There is no immunization history on file for this patient.   Objective: Vital Signs: BP 129/80   Pulse (!) 44   Temp 97.7 F (36.5 C)   Resp 14   Ht 5' 5.5 (1.664 m)   Wt 164 lb 3.2 oz (74.5 kg)   LMP 05/26/2016   BMI 26.91 kg/m    Physical Exam Vitals and nursing note reviewed.  Constitutional:      Appearance: She is well-developed.  HENT:     Head: Normocephalic and atraumatic.  Eyes:     Conjunctiva/sclera: Conjunctivae normal.  Cardiovascular:     Rate and Rhythm: Normal rate and regular rhythm.     Heart sounds: Normal  heart sounds.  Pulmonary:     Effort: Pulmonary effort is normal.     Breath sounds: Normal breath sounds.  Abdominal:     General: Bowel sounds are normal.     Palpations: Abdomen is soft.  Musculoskeletal:     Cervical back: Normal range of motion.  Lymphadenopathy:     Cervical: No cervical adenopathy.  Skin:    General: Skin is warm and dry.     Capillary Refill: Capillary refill takes less than 2 seconds.  Neurological:     Mental Status: She is alert and oriented to person, place, and time.  Psychiatric:        Behavior: Behavior normal.      Musculoskeletal Exam: Cervical, thoracic and lumbar spine were in good range of motion.  There was no SI joint tenderness.  Shoulder joints, elbow joints, wrist joints, MCPs, PIPs and DIPs were in good range of motion with no synovitis.  Hip joints and knee joints were in good range of motion without any warmth swelling or effusion.  There was no tenderness over ankles or MTPs.   CDAI Exam: CDAI Score: -- Patient Global: --; Provider Global: -- Swollen: --; Tender: -- Joint Exam 08/17/2024   No joint exam has been documented for this visit   There is currently no information documented on the homunculus. Go to the Rheumatology activity and complete the homunculus joint exam.  Investigation: No additional findings.  Imaging: No results found.  Recent Labs: Lab Results  Component Value Date   WBC 4.7 03/26/2023   HGB 10.1 (L) 03/26/2023   PLT 226 03/26/2023   NA 138 03/26/2023   K 4.2 03/26/2023   CL 105 03/26/2023   CO2 28 03/26/2023   GLUCOSE 89 03/26/2023   BUN 11 03/26/2023   CREATININE 0.57 03/26/2023   BILITOT 0.8 03/26/2023   ALKPHOS 81 12/09/2016   AST 17 03/26/2023   ALT 9 03/26/2023   PROT 7.6 03/26/2023   ALBUMIN 4.1 12/09/2016   CALCIUM 9.5 03/26/2023   GFRAA 112 02/14/2020   March 26, 2023 urine protein creatinine ratio normal, beta-2  GP 1 negative, anticardiolipin negative, lupus anticoagulant  negative, C3-C4 normal, ANA 1: 80 NS, dsDNA negative, sed rate 31, vitamin D  32  Speciality Comments: No specialty comments available.  Procedures:  No procedures performed Allergies: Patient has no known allergies.   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  No synovitis was noted on the examination.  No nailbed capillary changes or sclerodactyly was noted.  Labs in September 2024 were unremarkable except for low titer ANA.  I will recheck labs today.  Plan: Protein / creatinine ratio, urine, CBC with Differential/Platelet, Comprehensive metabolic panel with GFR, ANA, C3 and C4, Sedimentation rate, Anti-DNA antibody, double-stranded at [facility].  I advised her to contact us  if she develops any new symptoms.  Will contact her once the lab results are available.  Anticardiolipin antibody positive - Anticardiolipin IgG 21.3 on 02/12/2021.  Anticardiolipin antibody and beta-2  GP 1 had been negative since then.  Lupus anticoagulant positive-negative later.  Primary osteoarthritis of both knees-she had good range of motion of bilateral knee joints without any warmth swelling or effusion.  Vitamin D  deficiency-patient states she stopped taking vitamin D .  She will resume taking vitamin D .  Osteopenia of multiple sites - DEXA 02/28/2020 The BMD measured at [facility] is 0.777 g/cm2 with a T-score of-1.9.  Thalassemia, unspecified type - History of chronic anemia-stable.  Hair loss - referred to dermatology at the last visit.  Nail dystrophy  Dyslipidemia - Plan: Lipid panel  Orders: Orders Placed This Encounter  Procedures   CBC with Differential/Platelet   Comprehensive metabolic panel with GFR   Sedimentation rate   Lipid panel   Anti-DNA  antibody, double-stranded   C3 and C4   Protein / creatinine ratio, urine   ANA   No orders of the defined types were placed in this encounter.    Follow-Up Instructions: Return in about 1 year (around 08/17/2025) for Systemic lupus.   Maya Nash, MD  Note - This record has been created using Animal nutritionist.  Chart creation errors have been sought, but may not always  have been located. Such creation errors do not reflect on  the standard of medical care.     [1]  Social History Tobacco Use   Smoking status: Never    Passive exposure: Never   Smokeless tobacco: Never  Vaping Use   Vaping status: Never Used  Substance Use Topics   Alcohol use: No    Alcohol/week: 0.0 standard drinks of alcohol   Drug use: No   "

## 2024-08-17 ENCOUNTER — Ambulatory Visit: Admitting: Rheumatology

## 2024-08-17 ENCOUNTER — Encounter: Payer: Self-pay | Admitting: Rheumatology

## 2024-08-17 VITALS — BP 129/80 | HR 44 | Temp 97.7°F | Resp 14 | Ht 65.5 in | Wt 164.2 lb

## 2024-08-17 DIAGNOSIS — D569 Thalassemia, unspecified: Secondary | ICD-10-CM

## 2024-08-17 DIAGNOSIS — L603 Nail dystrophy: Secondary | ICD-10-CM

## 2024-08-17 DIAGNOSIS — E559 Vitamin D deficiency, unspecified: Secondary | ICD-10-CM

## 2024-08-17 DIAGNOSIS — R760 Raised antibody titer: Secondary | ICD-10-CM

## 2024-08-17 DIAGNOSIS — L659 Nonscarring hair loss, unspecified: Secondary | ICD-10-CM | POA: Diagnosis not present

## 2024-08-17 DIAGNOSIS — M3213 Lung involvement in systemic lupus erythematosus: Secondary | ICD-10-CM

## 2024-08-17 DIAGNOSIS — M8589 Other specified disorders of bone density and structure, multiple sites: Secondary | ICD-10-CM

## 2024-08-17 DIAGNOSIS — E785 Hyperlipidemia, unspecified: Secondary | ICD-10-CM | POA: Diagnosis not present

## 2024-08-17 DIAGNOSIS — M170 Bilateral primary osteoarthritis of knee: Secondary | ICD-10-CM

## 2024-08-18 ENCOUNTER — Ambulatory Visit: Payer: Self-pay | Admitting: Rheumatology

## 2024-08-18 NOTE — Progress Notes (Signed)
 Hemoglobin is low and stable.  CMP normal.  Sed rate remains elevated.  LDL elevated at 119, ANA negative, double-stranded DNA negative, complements normal, urine protein creatinine ratio pending.  Labs do not indicate an active autoimmune disease.  Please forward results to her PCP.

## 2024-08-19 LAB — COMPREHENSIVE METABOLIC PANEL WITH GFR
ALT: 12 [IU]/L (ref 0–32)
AST: 20 [IU]/L (ref 0–40)
Albumin: 4.7 g/dL (ref 3.9–4.9)
Alkaline Phosphatase: 99 [IU]/L (ref 49–135)
BUN/Creatinine Ratio: 17 (ref 12–28)
BUN: 9 mg/dL (ref 8–27)
Bilirubin Total: 0.8 mg/dL (ref 0.0–1.2)
CO2: 21 mmol/L (ref 20–29)
Calcium: 9.8 mg/dL (ref 8.7–10.3)
Chloride: 101 mmol/L (ref 96–106)
Creatinine, Ser: 0.54 mg/dL — ABNORMAL LOW (ref 0.57–1.00)
Globulin, Total: 3 g/dL (ref 1.5–4.5)
Glucose: 74 mg/dL (ref 70–99)
Potassium: 4.2 mmol/L (ref 3.5–5.2)
Sodium: 139 mmol/L (ref 134–144)
Total Protein: 7.7 g/dL (ref 6.0–8.5)
eGFR: 103 mL/min/{1.73_m2}

## 2024-08-19 LAB — LIPID PANEL
Chol/HDL Ratio: 2.8 ratio (ref 0.0–4.4)
Cholesterol, Total: 199 mg/dL (ref 100–199)
HDL: 72 mg/dL
LDL Chol Calc (NIH): 119 mg/dL — ABNORMAL HIGH (ref 0–99)
Triglycerides: 44 mg/dL (ref 0–149)
VLDL Cholesterol Cal: 8 mg/dL (ref 5–40)

## 2024-08-19 LAB — C3 AND C4
Complement C3, Serum: 123 mg/dL (ref 82–167)
Complement C4, Serum: 21 mg/dL (ref 12–38)

## 2024-08-19 LAB — CBC WITH DIFFERENTIAL/PLATELET
Basophils Absolute: 0.1 10*3/uL (ref 0.0–0.2)
Basos: 1 %
EOS (ABSOLUTE): 0.1 10*3/uL (ref 0.0–0.4)
Eos: 1 %
Hematocrit: 36.2 % (ref 34.0–46.6)
Hemoglobin: 10.8 g/dL — ABNORMAL LOW (ref 11.1–15.9)
Immature Grans (Abs): 0 10*3/uL (ref 0.0–0.1)
Immature Granulocytes: 0 %
Lymphocytes Absolute: 2.1 10*3/uL (ref 0.7–3.1)
Lymphs: 38 %
MCH: 22.9 pg — ABNORMAL LOW (ref 26.6–33.0)
MCHC: 29.8 g/dL — ABNORMAL LOW (ref 31.5–35.7)
MCV: 77 fL — ABNORMAL LOW (ref 79–97)
Monocytes Absolute: 0.5 10*3/uL (ref 0.1–0.9)
Monocytes: 9 %
Neutrophils Absolute: 2.7 10*3/uL (ref 1.4–7.0)
Neutrophils: 51 %
Platelets: 233 10*3/uL (ref 150–450)
RBC: 4.72 x10E6/uL (ref 3.77–5.28)
RDW: 12.9 % (ref 11.7–15.4)
WBC: 5.4 10*3/uL (ref 3.4–10.8)

## 2024-08-19 LAB — ANTI-DNA ANTIBODY, DOUBLE-STRANDED: dsDNA Ab: 1 [IU]/mL (ref 0–9)

## 2024-08-19 LAB — PROTEIN / CREATININE RATIO, URINE
Creatinine, Urine: 92.9 mg/dL
Protein, Ur: 6.4 mg/dL
Protein/Creat Ratio: 69 mg/g{creat} (ref 0–200)

## 2024-08-19 LAB — SEDIMENTATION RATE: Sed Rate: 48 mm/h — ABNORMAL HIGH (ref 0–40)

## 2024-08-19 LAB — ANA: Anti Nuclear Antibody (ANA): NEGATIVE

## 2025-08-17 ENCOUNTER — Ambulatory Visit: Admitting: Rheumatology
